# Patient Record
Sex: Female | Born: 1970 | Race: White | Hispanic: No | Marital: Married | Smoking: Former smoker
Health system: Southern US, Community
[De-identification: ages and names within clinical notes are randomized; demographics above are authoritative.]

## PROBLEM LIST (undated history)

## (undated) DIAGNOSIS — F419 Anxiety disorder, unspecified: Secondary | ICD-10-CM

## (undated) DIAGNOSIS — K589 Irritable bowel syndrome without diarrhea: Secondary | ICD-10-CM

## (undated) DIAGNOSIS — M1990 Unspecified osteoarthritis, unspecified site: Secondary | ICD-10-CM

## (undated) DIAGNOSIS — IMO0002 Reserved for concepts with insufficient information to code with codable children: Secondary | ICD-10-CM

## (undated) DIAGNOSIS — F32A Depression, unspecified: Secondary | ICD-10-CM

## (undated) DIAGNOSIS — G43909 Migraine, unspecified, not intractable, without status migrainosus: Secondary | ICD-10-CM

## (undated) DIAGNOSIS — A630 Anogenital (venereal) warts: Secondary | ICD-10-CM

## (undated) HISTORY — DX: Anogenital (venereal) warts: A63.0

## (undated) HISTORY — DX: Reserved for concepts with insufficient information to code with codable children: IMO0002

## (undated) HISTORY — PX: TONSILLECTOMY AND ADENOIDECTOMY: SUR1326

## (undated) HISTORY — DX: Irritable bowel syndrome, unspecified: K58.9

## (undated) HISTORY — DX: Migraine, unspecified, not intractable, without status migrainosus: G43.909

## (undated) HISTORY — PX: BREAST ENHANCEMENT SURGERY: SHX7

## (undated) HISTORY — PX: INTRAUTERINE DEVICE (IUD) INSERTION: SHX5877

## (undated) HISTORY — PX: COLPOSCOPY: SHX161

---

## 1998-03-22 ENCOUNTER — Other Ambulatory Visit: Admission: RE | Admit: 1998-03-22 | Discharge: 1998-03-22 | Payer: Self-pay | Admitting: Obstetrics and Gynecology

## 2001-08-17 ENCOUNTER — Other Ambulatory Visit: Admission: RE | Admit: 2001-08-17 | Discharge: 2001-08-17 | Payer: Self-pay | Admitting: Obstetrics and Gynecology

## 2002-08-08 ENCOUNTER — Other Ambulatory Visit: Admission: RE | Admit: 2002-08-08 | Discharge: 2002-08-08 | Payer: Self-pay | Admitting: Obstetrics and Gynecology

## 2003-08-11 ENCOUNTER — Other Ambulatory Visit: Admission: RE | Admit: 2003-08-11 | Discharge: 2003-08-11 | Payer: Self-pay | Admitting: Obstetrics and Gynecology

## 2004-11-24 HISTORY — PX: AUGMENTATION MAMMAPLASTY: SUR837

## 2004-12-02 ENCOUNTER — Other Ambulatory Visit: Admission: RE | Admit: 2004-12-02 | Discharge: 2004-12-02 | Payer: Self-pay | Admitting: Obstetrics and Gynecology

## 2006-01-27 ENCOUNTER — Other Ambulatory Visit: Admission: RE | Admit: 2006-01-27 | Discharge: 2006-01-27 | Payer: Self-pay | Admitting: Obstetrics and Gynecology

## 2006-07-29 ENCOUNTER — Encounter: Admission: RE | Admit: 2006-07-29 | Discharge: 2006-07-29 | Payer: Self-pay | Admitting: Family Medicine

## 2007-03-16 ENCOUNTER — Other Ambulatory Visit: Admission: RE | Admit: 2007-03-16 | Discharge: 2007-03-16 | Payer: Self-pay | Admitting: Obstetrics and Gynecology

## 2008-04-18 ENCOUNTER — Other Ambulatory Visit: Admission: RE | Admit: 2008-04-18 | Discharge: 2008-04-18 | Payer: Self-pay | Admitting: Obstetrics and Gynecology

## 2008-10-16 ENCOUNTER — Encounter: Admission: RE | Admit: 2008-10-16 | Discharge: 2008-10-16 | Payer: Self-pay | Admitting: Family Medicine

## 2009-10-31 ENCOUNTER — Encounter: Admission: RE | Admit: 2009-10-31 | Discharge: 2009-10-31 | Payer: Self-pay | Admitting: Sports Medicine

## 2009-11-22 ENCOUNTER — Encounter: Admission: RE | Admit: 2009-11-22 | Discharge: 2009-11-22 | Payer: Self-pay | Admitting: Sports Medicine

## 2009-11-26 ENCOUNTER — Encounter: Admission: RE | Admit: 2009-11-26 | Discharge: 2009-11-26 | Payer: Self-pay | Admitting: Family Medicine

## 2010-04-26 ENCOUNTER — Encounter: Admission: RE | Admit: 2010-04-26 | Discharge: 2010-04-26 | Payer: Self-pay | Admitting: Sports Medicine

## 2010-05-16 ENCOUNTER — Encounter: Admission: RE | Admit: 2010-05-16 | Discharge: 2010-05-16 | Payer: Self-pay | Admitting: Sports Medicine

## 2010-10-13 ENCOUNTER — Encounter: Admission: RE | Admit: 2010-10-13 | Discharge: 2010-10-13 | Payer: Self-pay | Admitting: Family Medicine

## 2010-10-22 ENCOUNTER — Encounter: Admission: RE | Admit: 2010-10-22 | Discharge: 2010-10-22 | Payer: Self-pay | Admitting: Sports Medicine

## 2010-11-07 ENCOUNTER — Encounter
Admission: RE | Admit: 2010-11-07 | Discharge: 2010-11-07 | Payer: Self-pay | Source: Home / Self Care | Attending: Sports Medicine | Admitting: Sports Medicine

## 2010-12-14 ENCOUNTER — Encounter: Payer: Self-pay | Admitting: Family Medicine

## 2011-01-02 ENCOUNTER — Other Ambulatory Visit: Payer: Self-pay | Admitting: Obstetrics and Gynecology

## 2011-01-02 DIAGNOSIS — Z1231 Encounter for screening mammogram for malignant neoplasm of breast: Secondary | ICD-10-CM

## 2011-01-09 ENCOUNTER — Ambulatory Visit
Admission: RE | Admit: 2011-01-09 | Discharge: 2011-01-09 | Disposition: A | Discharge status: Home / Self Care | Payer: 59 | Source: Ambulatory Visit | Attending: Obstetrics and Gynecology | Admitting: Obstetrics and Gynecology

## 2011-01-09 DIAGNOSIS — Z1231 Encounter for screening mammogram for malignant neoplasm of breast: Secondary | ICD-10-CM

## 2011-05-06 ENCOUNTER — Other Ambulatory Visit: Payer: Self-pay | Admitting: Sports Medicine

## 2011-05-06 DIAGNOSIS — M549 Dorsalgia, unspecified: Secondary | ICD-10-CM

## 2011-05-09 ENCOUNTER — Ambulatory Visit
Admission: RE | Admit: 2011-05-09 | Discharge: 2011-05-09 | Disposition: A | Discharge status: Home / Self Care | Payer: 59 | Source: Ambulatory Visit | Attending: Sports Medicine | Admitting: Sports Medicine

## 2011-05-09 ENCOUNTER — Other Ambulatory Visit: Payer: Self-pay | Admitting: Sports Medicine

## 2011-05-09 DIAGNOSIS — M545 Low back pain, unspecified: Secondary | ICD-10-CM

## 2011-05-09 DIAGNOSIS — M549 Dorsalgia, unspecified: Secondary | ICD-10-CM

## 2011-06-03 ENCOUNTER — Ambulatory Visit
Admission: RE | Admit: 2011-06-03 | Discharge: 2011-06-03 | Disposition: A | Discharge status: Home / Self Care | Payer: 59 | Source: Ambulatory Visit | Attending: Sports Medicine | Admitting: Sports Medicine

## 2011-06-03 DIAGNOSIS — M545 Low back pain, unspecified: Secondary | ICD-10-CM

## 2011-10-21 ENCOUNTER — Other Ambulatory Visit: Payer: Self-pay | Admitting: Sports Medicine

## 2011-10-21 DIAGNOSIS — M545 Low back pain, unspecified: Secondary | ICD-10-CM

## 2011-10-27 ENCOUNTER — Ambulatory Visit
Admission: RE | Admit: 2011-10-27 | Discharge: 2011-10-27 | Disposition: A | Discharge status: Home / Self Care | Payer: 59 | Source: Ambulatory Visit | Attending: Sports Medicine | Admitting: Sports Medicine

## 2011-10-27 DIAGNOSIS — M545 Low back pain, unspecified: Secondary | ICD-10-CM

## 2011-10-27 MED ORDER — IOHEXOL 180 MG/ML  SOLN
1.0000 mL | Freq: Once | INTRAMUSCULAR | Status: AC | PRN
  Administered 2011-10-27: 1 mL via EPIDURAL

## 2011-10-27 MED ORDER — METHYLPREDNISOLONE ACETATE 40 MG/ML INJ SUSP (RADIOLOG
120.0000 mg | Freq: Once | INTRAMUSCULAR | Status: AC
  Administered 2011-10-27: 120 mg via EPIDURAL

## 2011-10-28 ENCOUNTER — Other Ambulatory Visit: Payer: Self-pay | Admitting: Sports Medicine

## 2011-10-28 DIAGNOSIS — M5136 Other intervertebral disc degeneration, lumbar region: Secondary | ICD-10-CM

## 2011-11-11 ENCOUNTER — Ambulatory Visit
Admission: RE | Admit: 2011-11-11 | Discharge: 2011-11-11 | Disposition: A | Discharge status: Home / Self Care | Payer: 59 | Source: Ambulatory Visit | Attending: Sports Medicine | Admitting: Sports Medicine

## 2011-11-11 DIAGNOSIS — M5136 Other intervertebral disc degeneration, lumbar region: Secondary | ICD-10-CM

## 2011-11-11 MED ORDER — METHYLPREDNISOLONE ACETATE 40 MG/ML INJ SUSP (RADIOLOG
120.0000 mg | Freq: Once | INTRAMUSCULAR | Status: AC
  Administered 2011-11-11: 120 mg via EPIDURAL

## 2011-11-11 MED ORDER — IOHEXOL 180 MG/ML  SOLN
1.0000 mL | Freq: Once | INTRAMUSCULAR | Status: AC | PRN
  Administered 2011-11-11: 1 mL via EPIDURAL

## 2011-11-25 DIAGNOSIS — IMO0002 Reserved for concepts with insufficient information to code with codable children: Secondary | ICD-10-CM

## 2011-11-25 DIAGNOSIS — R87619 Unspecified abnormal cytological findings in specimens from cervix uteri: Secondary | ICD-10-CM

## 2011-11-25 HISTORY — DX: Unspecified abnormal cytological findings in specimens from cervix uteri: R87.619

## 2011-11-25 HISTORY — DX: Reserved for concepts with insufficient information to code with codable children: IMO0002

## 2011-12-30 ENCOUNTER — Other Ambulatory Visit: Payer: Self-pay | Admitting: Obstetrics and Gynecology

## 2011-12-30 ENCOUNTER — Other Ambulatory Visit: Payer: Self-pay | Admitting: Family Medicine

## 2011-12-30 DIAGNOSIS — Z1231 Encounter for screening mammogram for malignant neoplasm of breast: Secondary | ICD-10-CM

## 2012-01-19 ENCOUNTER — Ambulatory Visit
Admission: RE | Admit: 2012-01-19 | Discharge: 2012-01-19 | Disposition: A | Discharge status: Home / Self Care | Payer: 59 | Source: Ambulatory Visit | Attending: Family Medicine | Admitting: Family Medicine

## 2012-01-19 DIAGNOSIS — Z1231 Encounter for screening mammogram for malignant neoplasm of breast: Secondary | ICD-10-CM

## 2012-01-23 ENCOUNTER — Other Ambulatory Visit: Payer: Self-pay | Admitting: Family Medicine

## 2012-01-23 DIAGNOSIS — R928 Other abnormal and inconclusive findings on diagnostic imaging of breast: Secondary | ICD-10-CM

## 2012-01-28 ENCOUNTER — Ambulatory Visit
Admission: RE | Admit: 2012-01-28 | Discharge: 2012-01-28 | Disposition: A | Discharge status: Home / Self Care | Payer: 59 | Source: Ambulatory Visit | Attending: Family Medicine | Admitting: Family Medicine

## 2012-01-28 DIAGNOSIS — R928 Other abnormal and inconclusive findings on diagnostic imaging of breast: Secondary | ICD-10-CM

## 2012-02-04 ENCOUNTER — Other Ambulatory Visit: Payer: 59

## 2012-05-13 ENCOUNTER — Other Ambulatory Visit: Payer: Self-pay | Admitting: Sports Medicine

## 2012-05-13 DIAGNOSIS — M545 Low back pain, unspecified: Secondary | ICD-10-CM

## 2012-05-14 ENCOUNTER — Other Ambulatory Visit: Payer: Self-pay | Admitting: Sports Medicine

## 2012-05-14 ENCOUNTER — Ambulatory Visit
Admission: RE | Admit: 2012-05-14 | Discharge: 2012-05-14 | Disposition: A | Discharge status: Home / Self Care | Payer: 59 | Source: Ambulatory Visit | Attending: Sports Medicine | Admitting: Sports Medicine

## 2012-05-14 VITALS — BP 141/82 | HR 80

## 2012-05-14 DIAGNOSIS — M545 Low back pain, unspecified: Secondary | ICD-10-CM

## 2012-05-14 DIAGNOSIS — M549 Dorsalgia, unspecified: Secondary | ICD-10-CM

## 2012-05-14 MED ORDER — METHYLPREDNISOLONE ACETATE 40 MG/ML INJ SUSP (RADIOLOG
120.0000 mg | Freq: Once | INTRAMUSCULAR | Status: AC
  Administered 2012-05-14: 120 mg via EPIDURAL

## 2012-05-14 MED ORDER — IOHEXOL 180 MG/ML  SOLN
1.0000 mL | Freq: Once | INTRAMUSCULAR | Status: AC | PRN
  Administered 2012-05-14: 1 mL via EPIDURAL

## 2012-05-28 ENCOUNTER — Ambulatory Visit
Admission: RE | Admit: 2012-05-28 | Discharge: 2012-05-28 | Disposition: A | Discharge status: Home / Self Care | Payer: 59 | Source: Ambulatory Visit | Attending: Sports Medicine | Admitting: Sports Medicine

## 2012-05-28 DIAGNOSIS — M549 Dorsalgia, unspecified: Secondary | ICD-10-CM

## 2012-05-28 MED ORDER — IOHEXOL 180 MG/ML  SOLN
1.0000 mL | Freq: Once | INTRAMUSCULAR | Status: AC | PRN
  Administered 2012-05-28: 1 mL via EPIDURAL

## 2012-05-28 MED ORDER — METHYLPREDNISOLONE ACETATE 40 MG/ML INJ SUSP (RADIOLOG
120.0000 mg | Freq: Once | INTRAMUSCULAR | Status: AC
  Administered 2012-05-28: 120 mg via EPIDURAL

## 2012-10-04 ENCOUNTER — Other Ambulatory Visit: Payer: Self-pay | Admitting: Sports Medicine

## 2012-10-04 DIAGNOSIS — M545 Low back pain, unspecified: Secondary | ICD-10-CM

## 2012-10-11 ENCOUNTER — Ambulatory Visit
Admission: RE | Admit: 2012-10-11 | Discharge: 2012-10-11 | Disposition: A | Discharge status: Home / Self Care | Payer: 59 | Source: Ambulatory Visit | Attending: Sports Medicine | Admitting: Sports Medicine

## 2012-10-11 VITALS — BP 115/67 | HR 86

## 2012-10-11 DIAGNOSIS — M545 Low back pain, unspecified: Secondary | ICD-10-CM

## 2012-10-11 MED ORDER — IOHEXOL 180 MG/ML  SOLN
1.0000 mL | Freq: Once | INTRAMUSCULAR | Status: AC | PRN
  Administered 2012-10-11: 1 mL via EPIDURAL

## 2012-10-11 MED ORDER — METHYLPREDNISOLONE ACETATE 40 MG/ML INJ SUSP (RADIOLOG
120.0000 mg | Freq: Once | INTRAMUSCULAR | Status: AC
  Administered 2012-10-11: 120 mg via EPIDURAL

## 2013-02-01 ENCOUNTER — Other Ambulatory Visit: Payer: Self-pay

## 2013-02-01 DIAGNOSIS — Z1231 Encounter for screening mammogram for malignant neoplasm of breast: Secondary | ICD-10-CM

## 2013-03-09 ENCOUNTER — Ambulatory Visit
Admission: RE | Admit: 2013-03-09 | Discharge: 2013-03-09 | Disposition: A | Discharge status: Home / Self Care | Payer: 59 | Source: Ambulatory Visit

## 2013-03-09 DIAGNOSIS — Z1231 Encounter for screening mammogram for malignant neoplasm of breast: Secondary | ICD-10-CM

## 2013-05-17 ENCOUNTER — Other Ambulatory Visit: Payer: Self-pay | Admitting: Orthopedic Surgery

## 2013-05-17 DIAGNOSIS — M545 Low back pain, unspecified: Secondary | ICD-10-CM

## 2013-05-17 DIAGNOSIS — R531 Weakness: Secondary | ICD-10-CM

## 2013-07-01 ENCOUNTER — Ambulatory Visit
Admission: RE | Admit: 2013-07-01 | Discharge: 2013-07-01 | Disposition: A | Discharge status: Home / Self Care | Payer: 59 | Source: Ambulatory Visit | Attending: Orthopedic Surgery | Admitting: Orthopedic Surgery

## 2013-07-01 ENCOUNTER — Other Ambulatory Visit: Payer: Self-pay | Admitting: Orthopedic Surgery

## 2013-07-01 DIAGNOSIS — M545 Low back pain, unspecified: Secondary | ICD-10-CM

## 2013-07-01 DIAGNOSIS — R531 Weakness: Secondary | ICD-10-CM

## 2013-07-01 MED ORDER — IOHEXOL 180 MG/ML  SOLN
1.0000 mL | Freq: Once | INTRAMUSCULAR | Status: AC | PRN
  Administered 2013-07-01: 1 mL via EPIDURAL

## 2013-07-01 MED ORDER — METHYLPREDNISOLONE ACETATE 40 MG/ML INJ SUSP (RADIOLOG
120.0000 mg | Freq: Once | INTRAMUSCULAR | Status: AC
  Administered 2013-07-01: 120 mg via EPIDURAL

## 2013-07-11 ENCOUNTER — Ambulatory Visit
Admission: RE | Admit: 2013-07-11 | Discharge: 2013-07-11 | Disposition: A | Discharge status: Home / Self Care | Payer: 59 | Source: Ambulatory Visit | Attending: Orthopedic Surgery | Admitting: Orthopedic Surgery

## 2013-07-11 VITALS — BP 147/66 | HR 74

## 2013-07-11 DIAGNOSIS — M545 Low back pain, unspecified: Secondary | ICD-10-CM

## 2013-07-11 MED ORDER — IOHEXOL 180 MG/ML  SOLN
1.0000 mL | Freq: Once | INTRAMUSCULAR | Status: AC | PRN
  Administered 2013-07-11: 1 mL via EPIDURAL

## 2013-07-11 MED ORDER — METHYLPREDNISOLONE ACETATE 40 MG/ML INJ SUSP (RADIOLOG
120.0000 mg | Freq: Once | INTRAMUSCULAR | Status: AC
  Administered 2013-07-11: 120 mg via EPIDURAL

## 2013-09-28 ENCOUNTER — Encounter: Payer: Self-pay | Admitting: Certified Nurse Midwife

## 2013-09-29 ENCOUNTER — Ambulatory Visit (INDEPENDENT_AMBULATORY_CARE_PROVIDER_SITE_OTHER): Payer: 59 | Admitting: Certified Nurse Midwife

## 2013-09-29 ENCOUNTER — Encounter: Payer: Self-pay | Admitting: Certified Nurse Midwife

## 2013-09-29 VITALS — BP 112/68 | HR 68 | Resp 16 | Ht 68.25 in | Wt 244.0 lb

## 2013-09-29 DIAGNOSIS — Z0000 Encounter for general adult medical examination without abnormal findings: Secondary | ICD-10-CM

## 2013-09-29 DIAGNOSIS — Z01419 Encounter for gynecological examination (general) (routine) without abnormal findings: Secondary | ICD-10-CM

## 2013-09-29 LAB — POCT URINALYSIS DIPSTICK
Ketones, UA: NEGATIVE
Leukocytes, UA: NEGATIVE
Nitrite, UA: NEGATIVE
Protein, UA: NEGATIVE
pH, UA: 5

## 2013-09-29 NOTE — Progress Notes (Signed)
42 y.o. G0P0000 Divorced Caucasian Fe here for annual exam. Periods none with IUD.Marland Kitchen Patient has been having breast soreness with movement, and exercise. Had normal mammogram with 3 D. Denies breast skin change or nipple discharge. Patient wears sports bra all the time and has implants and works out daily. ? Soreness related to bra? Has lost 38 pounds! No partner change, no STD screening needed. Sees PCP prn, had labs with Neurologist. No other health issues today.  Looking [place] retreat!  Patient's last menstrual period was 09/25/2012.          Sexually active: yes  The current method of family planning is IUD.    Exercising: yes  cardio Smoker:  no  Health Maintenance: Pap:  09-27-12 neg +HPV HR, 16/18 genotype neg MMG:  4/14 Colonoscopy:  none BMD:   none TDaP:  2012 Labs: Poct urine-neg Self breast exam: done monthly   reports that she has quit smoking. She does not have any smokeless tobacco history on file. She reports that she drinks about 2.0 ounces of alcohol per week. She reports that she does not use illicit drugs.  Past Medical History  Diagnosis Date  . Condyloma   . Migraines     chronic,no aura  . IBS (irritable bowel syndrome)     Past Surgical History  Procedure Laterality Date  . Intrauterine device (iud) insertion      inserted in 06,removed, then new one inserted 4/11  . Breast enhancement surgery    . Tonsillectomy and adenoidectomy      Current Outpatient Prescriptions  Medication Sig Dispense Refill  . Cyclobenzaprine HCl (FLEXERIL PO) Take by mouth as needed.      Marland Kitchen levonorgestrel (MIRENA) 20 MCG/24HR IUD 1 each by Intrauterine route once.      . Multiple Vitamins-Minerals (MULTIVITAMIN PO) Take by mouth as needed.      . Naproxen (NAPROSYN PO) Take by mouth as needed.      . Topiramate (TOPAMAX PO) Take by mouth daily.        No current facility-administered medications for this visit.    Family History  Problem Relation Age of  Onset  . Hypertension Brother   . Diabetes Mother     ROS:  Pertinent items are noted in HPI.  Otherwise, a comprehensive ROS was negative.  Exam:   BP 112/68  Pulse 68  Resp 16  Ht 5' 8.25" (1.734 m)  Wt 244 lb (110.678 kg)  BMI 36.81 kg/m2  LMP 09/25/2012 Height: 5' 8.25" (173.4 cm)  Ht Readings from Last 3 Encounters:  09/29/13 5' 8.25" (1.734 m)    General appearance: alert, cooperative and appears stated age Head: Normocephalic, without obvious abnormality, atraumatic Neck: no adenopathy, supple, symmetrical, trachea midline and thyroid normal to inspection and palpation Lungs: clear to auscultation bilaterally Breasts: normal appearance, no masses or tenderness, No nipple retraction or dimpling, No nipple discharge or bleeding, No axillary or supraclavicular adenopathy Non tender to touch, points to outer edge of breast next to chest wall as sore area. Implants appear intact. Heart: regular rate and rhythm Abdomen: soft, non-tender; no masses,  no organomegaly Extremities: extremities normal, atraumatic, no cyanosis or edema Skin: Skin color, texture, turgor normal. No rashes or lesions Lymph nodes: Cervical, supraclavicular, and axillary nodes normal. No abnormal inguinal nodes palpated Neurologic: Grossly normal   Pelvic: External genitalia:  no lesions              Urethra:  normal appearing  urethra with no masses, tenderness or lesions              Bartholin's and Skene's: normal                 Vagina: normal appearing vagina with normal color and discharge, no lesions              Cervix: normal, non tender IUD string noted in cervix              Pap taken: yes Bimanual Exam:  Uterus:  normal size, contour, position, consistency, mobility, non-tender and anteverted              Adnexa: normal adnexa and no mass, fullness, tenderness               Rectovaginal: Confirms               Anus:  normal sphincter tone, no lesions  A:  Well Woman with normal  exam  Contraception Mirena IUD  History of negative pap with HPVHR +, 16, 18 negative  Breast soreness due to exercise or non supportive bra? Normal mammogram report reviewed. Normal exam  P:   Reviewed health and wellness pertinent to exam  Aware of warning signs with IUD due for removal 02/2015  Discussed wearing regular bra to see if soreness changes and decrease jumping with exercise for a week to see if change, also NSAID for 24-48 hours to see if change, if no change needs to advise. Patient feels it is her bra and the heaviness of implants. Also advised she may want to check with the MD who put implants in if continues also. Patient agrees.  Pap smear as per guidelines   Mammogram yearly pap smear taken today with HPVHR  counseled on breast self exam, mammography screening, adequate intake of calcium and vitamin D, diet and exercise return annually or prn   An After Visit Summary was printed and given to the patient.

## 2013-09-29 NOTE — Patient Instructions (Signed)

## 2013-10-02 NOTE — Progress Notes (Signed)
Note reviewed, agree with plan.  Jemal Miskell, MD  

## 2013-10-05 LAB — IPS PAP TEST WITH HPV

## 2013-10-06 ENCOUNTER — Other Ambulatory Visit: Payer: Self-pay | Admitting: Certified Nurse Midwife

## 2013-10-06 DIAGNOSIS — IMO0001 Reserved for inherently not codable concepts without codable children: Secondary | ICD-10-CM

## 2013-10-10 ENCOUNTER — Telehealth: Payer: Self-pay | Admitting: Emergency Medicine

## 2013-10-10 NOTE — Telephone Encounter (Signed)
Patient is returning a call to Tracy. °

## 2013-10-10 NOTE — Telephone Encounter (Signed)
Message left to return call to Juelle Dickmann at [phone].    

## 2013-10-10 NOTE — Telephone Encounter (Signed)
There is an order placed on  10/08/13 under procedues

## 2013-10-10 NOTE — Telephone Encounter (Signed)
Samantha Bird can you place an order for her colpo? I do not see one in.    I spoke with patient and informed of abnormal pap. Identity verified. Ensured this was a good time to discuss lab results.   Patient informed of lab results with abnormal pap.   Advised will need colposcopy for evaluation for further testing. Advised that colposcopy is very important test for follow up to evaluate cells.   Educated regarding procedure:  Looking at the cervix with a microscope to help see abnormal cells.  Sometimes a biopsy is taken, feels like a pinch then a cramp. Biopsies are then sent for testing.  When we have results, provider will decided time intervals for follow up. Procedure takes about 20-30 minutes.   Educated regarding instructions prior to procedure: Do not have intercourse the day before procedure.  Motrin instructions given. Motrin=Advil=Ibuprofen Can take 800 mg (Can purchase over the counter, you will need four 200 mg pills) for cramps.  Take with food. Make sure to eat a meal before appointment and drink plenty of fluids. Patient has IUD.   At end of call, patient states she does not have any further questions for me. Patient returned call about 20 minutes later and requested an earlier appointment. I advised that I placed her in earliest spot that could best accommodate her for length of procedure. Patient requested that she be called if any openings came up and I advised that it may be difficult because of length of time needed for appointment but that I would call if a time opened up.  Patient is agreeable. Patient sounded anxious on the phone and I attempted to assist with anxiety by advising that this was the next step only after abnormal pap and advised that provider can answer any further questions before procedure.

## 2013-10-10 NOTE — Telephone Encounter (Signed)
Thank you. Linked to appointment.

## 2013-10-10 NOTE — Telephone Encounter (Signed)
Message copied by Joeseph Amor on Mon Oct 10, 2013  9:32 AM ------      Message from: Verner Chol      Created: Thu Oct 06, 2013  7:42 AM       Notify patient that pap smear follow up is ASCUS and need colposcopy exam, HPVHR negative, but per recommendations if pap not negative needs colpo exam      Order in ------

## 2013-10-12 ENCOUNTER — Encounter: Payer: Self-pay | Admitting: Certified Nurse Midwife

## 2013-10-12 ENCOUNTER — Ambulatory Visit (INDEPENDENT_AMBULATORY_CARE_PROVIDER_SITE_OTHER): Payer: 59 | Admitting: Certified Nurse Midwife

## 2013-10-12 VITALS — BP 110/68 | HR 72 | Resp 16 | Ht 68.25 in | Wt 244.0 lb

## 2013-10-12 DIAGNOSIS — R6889 Other general symptoms and signs: Secondary | ICD-10-CM

## 2013-10-12 DIAGNOSIS — IMO0001 Reserved for inherently not codable concepts without codable children: Secondary | ICD-10-CM

## 2013-10-12 NOTE — Patient Instructions (Signed)

## 2013-10-12 NOTE — Progress Notes (Signed)
Patient ID: Samantha Bird, female   DOB: 1971/07/21, 42 y.o.   MRN: [id number]  Chief Complaint  Patient presents with  . Colposcopy    HPI Samantha Bird is a 42 y.o. female.  gopo here for colposcopy. Denies vaginal issues or bleeding. Patient has Mirena IUD in for contraception. Denies any problems today. HPI  Indications: Pap smear on September 30, 2013 showed: ASCUS with NEGATIVE high risk HPV. Previous pap HPVHR positive with 16,18 negative in 09/27/12. No treatment given.  Past Medical History  Diagnosis Date  . Condyloma   . Migraines     chronic,no aura  . IBS (irritable bowel syndrome)     Past Surgical History  Procedure Laterality Date  . Intrauterine device (iud) insertion      inserted in 06,removed, then new one inserted 4/11  . Breast enhancement surgery    . Tonsillectomy and adenoidectomy      Family History  Problem Relation Age of Onset  . Hypertension Brother   . Diabetes Mother     Social History History  Substance Use Topics  . Smoking status: Former Games developer  . Smokeless tobacco: Not on file  . Alcohol Use: 2.0 oz/week    4 drink(s) per week    Allergies  Allergen Reactions  . Penicillins Hives and Swelling    As a child     Current Outpatient Prescriptions  Medication Sig Dispense Refill  . Cyclobenzaprine HCl (FLEXERIL PO) Take by mouth as needed.      Marland Kitchen levonorgestrel (MIRENA) 20 MCG/24HR IUD 1 each by Intrauterine route once.      . Multiple Vitamins-Minerals (MULTIVITAMIN PO) Take by mouth as needed.      . Naproxen (NAPROSYN PO) Take by mouth as needed.      . Topiramate (TOPAMAX PO) Take by mouth daily.        No current facility-administered medications for this visit.    Review of Systems Review of Systems  Constitutional: Negative.   Genitourinary: Negative for vaginal bleeding, vaginal discharge and vaginal pain.    Blood pressure 110/68, pulse 72, resp. rate 16, height 5' 8.25" (1.734 m), weight 244 lb  (110.678 kg), last menstrual period 09/25/2012.  Physical Exam Physical Exam  Constitutional: She is oriented to person, place, and time. She appears well-developed and well-nourished.  Genitourinary: No vaginal discharge found.    Neurological: She is alert and oriented to person, place, and time.  Skin: Skin is warm and dry.  Psychiatric: She has a normal mood and affect. Judgment normal.    Data Reviewed Reviewed pap smear with patient and previous pap smear results.  Assessment   Healthy female here for Colposcopy for ASCUS with previous HPVHR positive.  Procedure Details  The risks and benefits of the procedure and Written informed consent obtained.Questions addressed.  Speculum placed in vagina and excellent visualization of cervix achieved, IUD string noted, cervix swabbed x 3 with saline solution and acetic acid solution. Cervix viewed with 3.75,7.5,15 # and green filter.HPV effect noted. No acetowhite lesion noted. Lugol's applied with staining noted. ECC obtained, with minimal bleeding and IUD string not involved. Monsel's applied. No bleeding noted on speculum removal. Patient tolerated procedure well. Instructions given.  Specimens: one  Complications: none.     Plan    Specimens labelled and sent to Pathology. Patient will notified of results when reviewed.    Pathology results: showed benign squamous mucosa with no dysplasia or HPV effect noted. Negative for endocervical glandular population.  Patient will be notified of results and need for repeat pap in one year. Pap recall   [facility] 10/12/2013, 2:22 PM

## 2013-10-12 NOTE — Progress Notes (Signed)
Pt has hx of ASCUS, -HPV on pap 09/30/13. Pt took 800mg  ibuprofen at 12:45

## 2013-10-19 ENCOUNTER — Telehealth: Payer: Self-pay | Admitting: *Deleted

## 2013-10-19 NOTE — Telephone Encounter (Signed)
VM confirms patient first and last name. LM calling with good report.LMTCB.

## 2013-10-19 NOTE — Telephone Encounter (Signed)
Message copied by Alisa Graff on Wed Oct 19, 2013  3:08 PM ------      Message from: Verner Chol      Created: Tue Oct 18, 2013  4:23 PM       Notify patient that pathology of Ecc showed benign squamous mucosa with no dysplasia or HPV effect. Negative for glandular changes.      Repeat pap smear in one year       Pap recall 08 ------

## 2013-10-19 NOTE — Progress Notes (Signed)
Note reviewed, agree with plan.  Morrell Fluke, MD  

## 2013-10-27 NOTE — Telephone Encounter (Signed)
LMTCB. [id number]

## 2013-10-27 NOTE — Telephone Encounter (Signed)
Message copied by Alisa Graff on Thu Oct 27, 2013 10:24 AM ------      Message from: Verner Chol      Created: Tue Oct 18, 2013  4:23 PM       Notify patient that pathology of Ecc showed benign squamous mucosa with no dysplasia or HPV effect. Negative for glandular changes.      Repeat pap smear in one year       Pap recall 08 ------

## 2013-11-01 NOTE — Telephone Encounter (Signed)
Patient returning call, notified of path results as directed by Debbi. Stressed importance of follow up pap in one year.  Patient is already scheduled for 10-02-14 and voiced understanding of follow up. 08 Recall entered.  Routing to provider for final review. Patient agreeable to disposition. Will close encounter

## 2014-02-13 ENCOUNTER — Other Ambulatory Visit: Payer: Self-pay | Admitting: Orthopedic Surgery

## 2014-02-13 DIAGNOSIS — M545 Low back pain, unspecified: Secondary | ICD-10-CM

## 2014-02-27 ENCOUNTER — Ambulatory Visit
Admission: RE | Admit: 2014-02-27 | Discharge: 2014-02-27 | Disposition: A | Discharge status: Home / Self Care | Payer: 59 | Source: Ambulatory Visit | Attending: Orthopedic Surgery | Admitting: Orthopedic Surgery

## 2014-02-27 DIAGNOSIS — M545 Low back pain, unspecified: Secondary | ICD-10-CM

## 2014-02-27 MED ORDER — IOHEXOL 180 MG/ML  SOLN
1.0000 mL | Freq: Once | INTRAMUSCULAR | Status: AC | PRN
  Administered 2014-02-27: 1 mL via EPIDURAL

## 2014-02-27 MED ORDER — METHYLPREDNISOLONE ACETATE 40 MG/ML INJ SUSP (RADIOLOG
120.0000 mg | Freq: Once | INTRAMUSCULAR | Status: AC
  Administered 2014-02-27: 120 mg via EPIDURAL

## 2014-02-27 NOTE — Discharge Instructions (Signed)

## 2014-03-13 ENCOUNTER — Other Ambulatory Visit: Payer: Self-pay | Admitting: Orthopedic Surgery

## 2014-03-13 DIAGNOSIS — M549 Dorsalgia, unspecified: Secondary | ICD-10-CM

## 2014-04-06 ENCOUNTER — Ambulatory Visit
Admission: RE | Admit: 2014-04-06 | Discharge: 2014-04-06 | Disposition: A | Discharge status: Home / Self Care | Payer: 59 | Source: Ambulatory Visit | Attending: Orthopedic Surgery | Admitting: Orthopedic Surgery

## 2014-04-06 DIAGNOSIS — M549 Dorsalgia, unspecified: Secondary | ICD-10-CM

## 2014-04-06 MED ORDER — IOHEXOL 180 MG/ML  SOLN
1.0000 mL | Freq: Once | INTRAMUSCULAR | Status: AC | PRN
  Administered 2014-04-06: 1 mL via EPIDURAL

## 2014-04-06 MED ORDER — METHYLPREDNISOLONE ACETATE 40 MG/ML INJ SUSP (RADIOLOG
120.0000 mg | Freq: Once | INTRAMUSCULAR | Status: AC
  Administered 2014-04-06: 120 mg via EPIDURAL

## 2014-04-10 ENCOUNTER — Other Ambulatory Visit: Payer: Self-pay

## 2014-04-10 DIAGNOSIS — Z1231 Encounter for screening mammogram for malignant neoplasm of breast: Secondary | ICD-10-CM

## 2014-04-18 ENCOUNTER — Ambulatory Visit
Admission: RE | Admit: 2014-04-18 | Discharge: 2014-04-18 | Disposition: A | Discharge status: Home / Self Care | Payer: 59 | Source: Ambulatory Visit

## 2014-04-18 ENCOUNTER — Encounter (INDEPENDENT_AMBULATORY_CARE_PROVIDER_SITE_OTHER): Payer: Self-pay

## 2014-04-18 DIAGNOSIS — Z1231 Encounter for screening mammogram for malignant neoplasm of breast: Secondary | ICD-10-CM

## 2014-09-06 ENCOUNTER — Other Ambulatory Visit: Payer: Self-pay | Admitting: Orthopedic Surgery

## 2014-09-06 DIAGNOSIS — M545 Low back pain, unspecified: Secondary | ICD-10-CM

## 2014-09-06 DIAGNOSIS — G8929 Other chronic pain: Secondary | ICD-10-CM

## 2014-09-14 ENCOUNTER — Ambulatory Visit
Admission: RE | Admit: 2014-09-14 | Discharge: 2014-09-14 | Disposition: A | Discharge status: Home / Self Care | Payer: 59 | Source: Ambulatory Visit | Attending: Orthopedic Surgery | Admitting: Orthopedic Surgery

## 2014-09-14 VITALS — BP 117/69 | HR 70

## 2014-09-14 DIAGNOSIS — M545 Low back pain, unspecified: Secondary | ICD-10-CM

## 2014-09-14 DIAGNOSIS — G8929 Other chronic pain: Secondary | ICD-10-CM

## 2014-09-14 MED ORDER — IOHEXOL 180 MG/ML  SOLN
1.0000 mL | Freq: Once | INTRAMUSCULAR | Status: AC | PRN
  Administered 2014-09-14: 1 mL via EPIDURAL

## 2014-09-14 MED ORDER — METHYLPREDNISOLONE ACETATE 40 MG/ML INJ SUSP (RADIOLOG
120.0000 mg | Freq: Once | INTRAMUSCULAR | Status: AC
  Administered 2014-09-14: 120 mg via EPIDURAL

## 2014-09-15 ENCOUNTER — Other Ambulatory Visit: Payer: Self-pay | Admitting: Orthopedic Surgery

## 2014-09-15 DIAGNOSIS — M545 Low back pain, unspecified: Secondary | ICD-10-CM

## 2014-09-15 DIAGNOSIS — G8929 Other chronic pain: Secondary | ICD-10-CM

## 2014-09-29 ENCOUNTER — Encounter (HOSPITAL_BASED_OUTPATIENT_CLINIC_OR_DEPARTMENT_OTHER): Payer: Self-pay | Admitting: *Deleted

## 2014-09-29 ENCOUNTER — Emergency Department (HOSPITAL_BASED_OUTPATIENT_CLINIC_OR_DEPARTMENT_OTHER): Payer: 59

## 2014-09-29 ENCOUNTER — Emergency Department (HOSPITAL_BASED_OUTPATIENT_CLINIC_OR_DEPARTMENT_OTHER)
Admission: EM | Admit: 2014-09-29 | Discharge: 2014-09-29 | Disposition: A | Discharge status: Home / Self Care | Payer: 59 | Attending: Emergency Medicine | Admitting: Emergency Medicine

## 2014-09-29 DIAGNOSIS — Z79899 Other long term (current) drug therapy: Secondary | ICD-10-CM | POA: Diagnosis not present

## 2014-09-29 DIAGNOSIS — Z87891 Personal history of nicotine dependence: Secondary | ICD-10-CM | POA: Insufficient documentation

## 2014-09-29 DIAGNOSIS — Z880 Allergy status to penicillin: Secondary | ICD-10-CM | POA: Insufficient documentation

## 2014-09-29 DIAGNOSIS — R0789 Other chest pain: Secondary | ICD-10-CM | POA: Diagnosis not present

## 2014-09-29 DIAGNOSIS — G43909 Migraine, unspecified, not intractable, without status migrainosus: Secondary | ICD-10-CM | POA: Diagnosis not present

## 2014-09-29 DIAGNOSIS — Z8619 Personal history of other infectious and parasitic diseases: Secondary | ICD-10-CM | POA: Insufficient documentation

## 2014-09-29 DIAGNOSIS — Z8719 Personal history of other diseases of the digestive system: Secondary | ICD-10-CM | POA: Insufficient documentation

## 2014-09-29 DIAGNOSIS — R079 Chest pain, unspecified: Secondary | ICD-10-CM | POA: Diagnosis present

## 2014-09-29 LAB — COMPREHENSIVE METABOLIC PANEL
ALK PHOS: 88 U/L (ref 39–117)
ALT: 22 U/L (ref 0–35)
ANION GAP: 14 (ref 5–15)
AST: 15 U/L (ref 0–37)
Albumin: 3.7 g/dL (ref 3.5–5.2)
BUN: 13 mg/dL (ref 6–23)
CALCIUM: 9.4 mg/dL (ref 8.4–10.5)
CO2: 22 mEq/L (ref 19–32)
Chloride: 106 mEq/L (ref 96–112)
Creatinine, Ser: 0.8 mg/dL (ref 0.50–1.10)
GFR calc non Af Amer: 88 mL/min — ABNORMAL LOW (ref >90)
GLUCOSE: 105 mg/dL — AB (ref 70–99)
Potassium: 3.9 mEq/L (ref 3.7–5.3)
Sodium: 142 mEq/L (ref 137–147)
Total Bilirubin: 0.4 mg/dL (ref 0.3–1.2)
Total Protein: 7.6 g/dL (ref 6.0–8.3)

## 2014-09-29 LAB — CBC
HEMATOCRIT: 40.3 % (ref 36.0–46.0)
Hemoglobin: 14.1 g/dL (ref 12.0–15.0)
MCH: 32 pg (ref 26.0–34.0)
MCHC: 35 g/dL (ref 30.0–36.0)
MCV: 91.6 fL (ref 78.0–100.0)
Platelets: 202 10*3/uL (ref 150–400)
RBC: 4.4 MIL/uL (ref 3.87–5.11)
RDW: 11.8 % (ref 11.5–15.5)
WBC: 7.1 10*3/uL (ref 4.0–10.5)

## 2014-09-29 LAB — D-DIMER, QUANTITATIVE: D-Dimer, Quant: <0.27 ug/mL-FEU (ref 0.00–0.48)

## 2014-09-29 LAB — TROPONIN I: Troponin I: <0.3 ng/mL (ref <0.30)

## 2014-09-29 MED ORDER — ASPIRIN 81 MG PO CHEW
324.0000 mg | CHEWABLE_TABLET | Freq: Once | ORAL | Status: AC
  Administered 2014-09-29: 324 mg via ORAL
  Filled 2014-09-29: qty 4

## 2014-09-29 NOTE — Discharge Instructions (Signed)

## 2014-09-29 NOTE — ED Provider Notes (Signed)
CSN: [id number]     Arrival date & time 09/29/14  0841 History   First MD Initiated Contact with Patient 09/29/14 [id number]     Chief Complaint  Patient presents with  . Chest Pain     (Consider location/radiation/quality/duration/timing/severity/associated sxs/prior Treatment) HPI  43 year old female comes in today complaining of pain underneath left breast that began last night in the evening. She cannot identify any inciting factors. The pain was sharp underneath her left breast. It radiated to the left side. She thought there was some tingling in her arm. The pain resolved after she went to bed. She has felt some residual tightness in this area today. It worsens with movement and denies with deep breathing. She does not feel short of breath, no cough, no hemoptysis, or history of DVT or pulmonary embolism or immobilization. She is not having any fever or chills. Is no history of coronary artery disease. It is not worse with exertion but is worse with certain movements. Past Medical History  Diagnosis Date  . Condyloma   . Migraines     chronic,no aura  . IBS (irritable bowel syndrome)   . Abnormal Pap smear 2013    neg pap +HPV HR, 16/18 genotype neg   Past Surgical History  Procedure Laterality Date  . Intrauterine device (iud) insertion      inserted in 06,removed, then new one inserted 4/11  . Breast enhancement surgery    . Tonsillectomy and adenoidectomy     Family History  Problem Relation Age of Onset  . Hypertension Brother   . Diabetes Mother    History  Substance Use Topics  . Smoking status: Former Games developermoker  . Smokeless tobacco: Not on file  . Alcohol Use: 2.0 oz/week    4 drink(s) per week   OB History    Gravida Para Term Preterm AB TAB SAB Ectopic Multiple Living   0 0 0 0 0 0 0 0 0 0      Review of Systems  All other systems reviewed and are negative.     Allergies  Penicillins  Home Medications   Prior to Admission medications   Medication Sig  Start Date End Date Taking? Authorizing Provider  Cyclobenzaprine HCl (FLEXERIL PO) Take by mouth as needed.    Historical Provider, MD  levonorgestrel (MIRENA) 20 MCG/24HR IUD 1 each by Intrauterine route once.    Historical Provider, MD  Multiple Vitamins-Minerals (MULTIVITAMIN PO) Take by mouth as needed.    Historical Provider, MD  Naproxen (NAPROSYN PO) Take by mouth as needed.    Historical Provider, MD  Topiramate (TOPAMAX PO) Take by mouth daily.     Historical Provider, MD   BP 114/74 mmHg  Pulse 80  Temp(Src) 98.1 F (36.7 C) (Oral)  Resp 22  Ht 5\' 9"  (1.753 m)  Wt 250 lb (113.399 kg)  BMI 36.90 kg/m2  SpO2 97% Physical Exam  Constitutional: She is oriented to person, place, and time. She appears well-developed and well-nourished.  HENT:  Head: Normocephalic and atraumatic.  Right Ear: External ear normal.  Left Ear: External ear normal.  Nose: Nose normal.  Mouth/Throat: Oropharynx is clear and moist.  Eyes: Conjunctivae and EOM are normal. Pupils are equal, round, and reactive to light.  Neck: Normal range of motion. Neck supple. No JVD present. No tracheal deviation present. No thyromegaly present.  Cardiovascular: Normal rate, regular rhythm, normal heart sounds and intact distal pulses.   Pulmonary/Chest: Effort normal and breath sounds normal. She has no  wheezes. She exhibits tenderness.    Abdominal: Soft. Bowel sounds are normal. She exhibits no mass. There is no tenderness. There is no guarding.  Musculoskeletal: Normal range of motion.  Lymphadenopathy:    She has no cervical adenopathy.  Neurological: She is alert and oriented to person, place, and time. She has normal reflexes. No cranial nerve deficit or sensory deficit. Gait normal. GCS eye subscore is 4. GCS verbal subscore is 5. GCS motor subscore is 6.  Reflex Scores:      Bicep reflexes are 2+ on the right side and 2+ on the left side.      Patellar reflexes are 2+ on the right side and 2+ on the left  side. Strength is 5/5 bilateral elbow flexor/extensors, wrist extension/flexion, intrinsic hand strength equal Bilateral hip flexion/extension 5/5, knee flexion/extension 5/5, ankle 5/5 flexion extension    Skin: Skin is warm and dry.  Psychiatric: She has a normal mood and affect. Her behavior is normal. Judgment and thought content normal.  Nursing note and vitals reviewed.   ED Course  Procedures (including critical care time) Labs Review Labs Reviewed  COMPREHENSIVE METABOLIC PANEL - Abnormal; Notable for the following:    Glucose, Bld 105 (*)    GFR calc non Af Amer 88 (*)    All other components within normal limits  CBC  TROPONIN I  D-DIMER, QUANTITATIVE    Imaging Review Dg Chest 2 View  09/29/2014   CLINICAL DATA:  Chest pain  EXAM: CHEST  2 VIEW  COMPARISON:  None.  FINDINGS: The heart size and mediastinal contours are within normal limits. Both lungs are clear. The visualized skeletal structures are unremarkable.  IMPRESSION: No active cardiopulmonary disease.   Electronically Signed   By: Alcide CleverMark  Lukens M.D.   On: 09/29/2014 09:48     EKG Interpretation   Date/Time:  Friday September 29 2014 08:50:58 EST Ventricular Rate:  74 PR Interval:  164 QRS Duration: 88 QT Interval:  394 QTC Calculation: 437 R Axis:   57 Text Interpretation:  Normal sinus rhythm Poor R wave progression nsst  Confirmed by Muneer Leider MD, Solveig Fangman ([id number]) on 09/29/2014 8:59:46 AM      MDM   Final diagnoses:  Left-sided chest wall pain    Patient with some minor discomfort here which she does not feel she needs any medication for. Chest x-Bohdi Leeds is clear EKG shows no evidence of acute ischemia although she has poor R-wave progression. Her troponins are normal and d-dimer is negative.she has not a smoker, no history of hypertension, no history of family coronary artery disease. With normal troponin and no acute changes on chest x-Lodema Parma have a low index of suspicion for acute coronary syndrome. I have  advised the patient on return precautions and need for follow-up and she voices understanding.    Hilario Quarryanielle S Javeria Briski, MD 09/29/14 [phone]

## 2014-09-29 NOTE — ED Notes (Signed)
Pt reports "shooting ache" to beneath her left breast x last night, off and on. Pt reports some numbness and tingling to her left arm also, denies any at this time. Pt states she bent over to pick up something and stood up feeling lightheaded. ekg performed while pt being triaged.

## 2014-10-02 ENCOUNTER — Ambulatory Visit: Payer: 59 | Admitting: Certified Nurse Midwife

## 2014-11-13 ENCOUNTER — Ambulatory Visit (INDEPENDENT_AMBULATORY_CARE_PROVIDER_SITE_OTHER): Payer: 59 | Admitting: Certified Nurse Midwife

## 2014-11-13 ENCOUNTER — Encounter: Payer: Self-pay | Admitting: Certified Nurse Midwife

## 2014-11-13 VITALS — BP 108/72 | HR 72 | Resp 16 | Ht 68.25 in | Wt 259.0 lb

## 2014-11-13 DIAGNOSIS — Z124 Encounter for screening for malignant neoplasm of cervix: Secondary | ICD-10-CM

## 2014-11-13 DIAGNOSIS — Z01419 Encounter for gynecological examination (general) (routine) without abnormal findings: Secondary | ICD-10-CM

## 2014-11-13 DIAGNOSIS — Z0000 Encounter for general adult medical examination without abnormal findings: Secondary | ICD-10-CM

## 2014-11-13 LAB — POCT URINALYSIS DIPSTICK
Bilirubin, UA: NEGATIVE
Blood, UA: NEGATIVE
Glucose, UA: NEGATIVE
Ketones, UA: NEGATIVE
Leukocytes, UA: NEGATIVE
Nitrite, UA: NEGATIVE
PROTEIN UA: NEGATIVE
UROBILINOGEN UA: NEGATIVE
pH, UA: 5

## 2014-11-13 NOTE — Progress Notes (Signed)
43 y.o. G0P0000 Divorced Caucasian Fe here for annual exam.  Periods are scant with Mirena IUD. Sees PCP for labs and aex. Was seen in ER for chest wall pain as final diagnosis. Migraines have not increased, saw Neurology for medication management and work up for vertigo. No health issues today. Just returned from another cruise!  No LMP recorded. Patient is not currently having periods (Reason: IUD).          Sexually active: Yes.    The current method of family planning is IUD.    Exercising: Yes.    walking Smoker:  no  Health Maintenance: Pap:  ASCUS HPV HR neg, 10-12-13 EC neg MMG: 04-18-14 category b density,birads category 1:neg Colonoscopy:  none BMD:   none TDaP:  2012 Labs: Poct urine-neg Self breast exam: done occ   reports that she has quit smoking. She does not have any smokeless tobacco history on file. She reports that she drinks alcohol. She reports that she does not use illicit drugs.  Past Medical History  Diagnosis Date  . Condyloma   . Migraines     chronic,no aura  . IBS (irritable bowel syndrome)   . 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 Intrauterine device (iud) insertion      inserted in 06,removed, then new one inserted 4/11  . Breast enhancement surgery    . Tonsillectomy and adenoidectomy      Current Outpatient Prescriptions  Medication Sig Dispense Refill  . levonorgestrel (MIRENA) 20 MCG/24HR IUD 1 each by Intrauterine route once.    . Multiple Vitamins-Minerals (MULTIVITAMIN PO) Take by mouth as needed.    . topiramate (TOPAMAX) 100 MG tablet daily.    . Cyclobenzaprine HCl (FLEXERIL PO) Take by mouth as needed.    . meclizine (ANTIVERT) 25 MG tablet Take 25 mg by mouth every 8 (eight) hours as needed.  1  . naproxen (NAPROSYN) 500 MG tablet      No current facility-administered medications for this visit.    Family History  Problem Relation Age of Onset  .  Hypertension Brother   . Diabetes Mother     ROS:  Pertinent items are noted in HPI.  Otherwise, a comprehensive ROS was negative.  Exam:   BP 108/72 mmHg  Pulse 72  Resp 16  Ht 5' 8.25" (1.734 m)  Wt 259 lb (117.482 kg)  BMI 39.07 kg/m2 Height: 5' 8.25" (173.4 cm)  Ht Readings from Last 3 Encounters:  11/13/14 5' 8.25" (1.734 m)  09/29/14 5\' 9"  (1.753 m)  10/12/13 5' 8.25" (1.734 m)    General appearance: alert, cooperative and appears stated age Head: Normocephalic, without obvious abnormality, atraumatic Neck: no adenopathy, supple, symmetrical, trachea midline and thyroid normal to inspection and palpation Lungs: clear to auscultation bilaterally Breasts: normal appearance, no masses or tenderness, No nipple retraction or dimpling, No nipple discharge or bleeding, No axillary or supraclavicular adenopathy Heart: regular rate and rhythm Abdomen: soft, non-tender; no masses,  no organomegaly Extremities: extremities normal, atraumatic, no cyanosis or edema Skin: Skin color, texture, turgor normal. No rashes or lesions Lymph nodes: Cervical, supraclavicular, and axillary nodes normal. No abnormal inguinal nodes palpated Neurologic: Grossly normal   Pelvic: External genitalia:  no lesions              Urethra:  normal appearing urethra with no masses, tenderness or lesions  Bartholin's and Skene's: normal                 Vagina: normal appearing vagina with normal color and discharge, no lesions              Cervix: normal, non tender, no lesions, IUD string noted              Pap taken: Yes.   Bimanual Exam:  Uterus:  normal size, contour, position, consistency, mobility, non-tender and mid position              Adnexa: normal adnexa and no mass, fullness, tenderness               Rectovaginal: Confirms               Anus:  normal sphincter tone, no lesions  A:  Well Woman with normal exam  Contraception Mirena IUD due for removal 03/07/15  P:   Reviewed  health and wellness pertinent to exam  Patient plans removal and reinsertion of new Mirena IUD. Instructed patient to call one month prior to expiration date for insurance precert and scheduling with MD..  Pap smear taken today with HPVHR as follow up from abnormal of ASCUS. If negative repeat one year, if not per results.  counseled on breast self exam, mammography screening, adequate intake of calcium and vitamin D, diet and exercise, weight loss  return annually or prn  An After Visit Summary was printed and given to the patient.

## 2014-11-13 NOTE — Patient Instructions (Signed)

## 2014-11-13 NOTE — Progress Notes (Signed)
Reviewed personally.  M. Suzanne Katalin Colledge, MD.  

## 2014-11-15 LAB — IPS PAP TEST WITH HPV

## 2014-11-28 ENCOUNTER — Ambulatory Visit
Admission: RE | Admit: 2014-11-28 | Discharge: 2014-11-28 | Disposition: A | Discharge status: Home / Self Care | Payer: 59 | Source: Ambulatory Visit | Attending: Orthopedic Surgery | Admitting: Orthopedic Surgery

## 2014-11-28 DIAGNOSIS — M545 Low back pain, unspecified: Secondary | ICD-10-CM

## 2014-11-28 DIAGNOSIS — G8929 Other chronic pain: Secondary | ICD-10-CM

## 2014-11-28 MED ORDER — METHYLPREDNISOLONE ACETATE 40 MG/ML INJ SUSP (RADIOLOG
120.0000 mg | Freq: Once | INTRAMUSCULAR | Status: AC
  Administered 2014-11-28: 120 mg via EPIDURAL

## 2014-11-28 MED ORDER — IOHEXOL 180 MG/ML  SOLN
1.0000 mL | Freq: Once | INTRAMUSCULAR | Status: AC | PRN
Start: 2014-11-28 — End: 2014-11-28
  Administered 2014-11-28: 1 mL via EPIDURAL

## 2015-02-20 ENCOUNTER — Other Ambulatory Visit: Payer: Self-pay | Admitting: Orthopedic Surgery

## 2015-02-20 DIAGNOSIS — M545 Low back pain: Secondary | ICD-10-CM

## 2015-02-27 ENCOUNTER — Ambulatory Visit
Admission: RE | Admit: 2015-02-27 | Discharge: 2015-02-27 | Disposition: A | Discharge status: Home / Self Care | Payer: 59 | Source: Ambulatory Visit | Attending: Orthopedic Surgery | Admitting: Orthopedic Surgery

## 2015-02-27 DIAGNOSIS — M545 Low back pain: Secondary | ICD-10-CM

## 2015-02-27 MED ORDER — METHYLPREDNISOLONE ACETATE 40 MG/ML INJ SUSP (RADIOLOG
120.0000 mg | Freq: Once | INTRAMUSCULAR | Status: AC
  Administered 2015-02-27: 120 mg via EPIDURAL

## 2015-02-27 MED ORDER — IOHEXOL 180 MG/ML  SOLN
1.0000 mL | Freq: Once | INTRAMUSCULAR | Status: AC | PRN
  Administered 2015-02-27: 1 mL via EPIDURAL

## 2015-03-16 ENCOUNTER — Other Ambulatory Visit: Payer: Self-pay | Admitting: Orthopedic Surgery

## 2015-03-16 DIAGNOSIS — M5136 Other intervertebral disc degeneration, lumbar region: Secondary | ICD-10-CM

## 2015-03-19 ENCOUNTER — Other Ambulatory Visit: Payer: Self-pay

## 2015-03-19 DIAGNOSIS — Z1231 Encounter for screening mammogram for malignant neoplasm of breast: Secondary | ICD-10-CM

## 2015-03-22 ENCOUNTER — Ambulatory Visit
Admission: RE | Admit: 2015-03-22 | Discharge: 2015-03-22 | Disposition: A | Discharge status: Home / Self Care | Payer: 59 | Source: Ambulatory Visit | Attending: Orthopedic Surgery | Admitting: Orthopedic Surgery

## 2015-03-22 ENCOUNTER — Other Ambulatory Visit: Payer: Self-pay | Admitting: Orthopedic Surgery

## 2015-03-22 DIAGNOSIS — M5136 Other intervertebral disc degeneration, lumbar region: Secondary | ICD-10-CM

## 2015-03-22 MED ORDER — IOHEXOL 180 MG/ML  SOLN
1.0000 mL | Freq: Once | INTRAMUSCULAR | Status: AC | PRN
  Administered 2015-03-22: 1 mL via EPIDURAL

## 2015-03-22 MED ORDER — METHYLPREDNISOLONE ACETATE 40 MG/ML INJ SUSP (RADIOLOG
120.0000 mg | Freq: Once | INTRAMUSCULAR | Status: AC
  Administered 2015-03-22: 120 mg via EPIDURAL

## 2015-05-07 ENCOUNTER — Ambulatory Visit
Admission: RE | Admit: 2015-05-07 | Discharge: 2015-05-07 | Disposition: A | Discharge status: Home / Self Care | Payer: 59 | Source: Ambulatory Visit

## 2015-05-07 DIAGNOSIS — Z1231 Encounter for screening mammogram for malignant neoplasm of breast: Secondary | ICD-10-CM

## 2015-10-02 ENCOUNTER — Other Ambulatory Visit: Payer: Self-pay | Admitting: Orthopedic Surgery

## 2015-10-02 DIAGNOSIS — M5136 Other intervertebral disc degeneration, lumbar region: Secondary | ICD-10-CM

## 2015-10-23 ENCOUNTER — Ambulatory Visit
Admission: RE | Admit: 2015-10-23 | Discharge: 2015-10-23 | Disposition: A | Discharge status: Home / Self Care | Payer: 59 | Source: Ambulatory Visit | Attending: Orthopedic Surgery | Admitting: Orthopedic Surgery

## 2015-10-23 DIAGNOSIS — M5136 Other intervertebral disc degeneration, lumbar region: Secondary | ICD-10-CM

## 2015-10-23 MED ORDER — METHYLPREDNISOLONE ACETATE 40 MG/ML INJ SUSP (RADIOLOG
120.0000 mg | Freq: Once | INTRAMUSCULAR | Status: AC
  Administered 2015-10-23: 120 mg via INTRA_ARTICULAR

## 2015-10-23 MED ORDER — IOHEXOL 180 MG/ML  SOLN
1.0000 mL | Freq: Once | INTRAMUSCULAR | Status: AC | PRN
  Administered 2015-10-23: 1 mL via INTRA_ARTICULAR

## 2015-10-29 ENCOUNTER — Other Ambulatory Visit: Payer: Self-pay | Admitting: Orthopedic Surgery

## 2015-10-29 DIAGNOSIS — M5136 Other intervertebral disc degeneration, lumbar region: Secondary | ICD-10-CM

## 2015-11-05 ENCOUNTER — Ambulatory Visit
Admission: RE | Admit: 2015-11-05 | Discharge: 2015-11-05 | Disposition: A | Discharge status: Home / Self Care | Payer: 59 | Source: Ambulatory Visit | Attending: Orthopedic Surgery | Admitting: Orthopedic Surgery

## 2015-11-05 DIAGNOSIS — M5136 Other intervertebral disc degeneration, lumbar region: Secondary | ICD-10-CM

## 2015-11-05 MED ORDER — IOHEXOL 180 MG/ML  SOLN
1.0000 mL | Freq: Once | INTRAMUSCULAR | Status: AC | PRN
  Administered 2015-11-05: 1 mL via EPIDURAL

## 2015-11-05 MED ORDER — METHYLPREDNISOLONE ACETATE 40 MG/ML INJ SUSP (RADIOLOG
120.0000 mg | Freq: Once | INTRAMUSCULAR | Status: AC
  Administered 2015-11-05: 120 mg via EPIDURAL

## 2015-11-28 ENCOUNTER — Other Ambulatory Visit: Payer: Self-pay | Admitting: Certified Nurse Midwife

## 2015-11-28 ENCOUNTER — Encounter: Payer: Self-pay | Admitting: Certified Nurse Midwife

## 2015-11-28 ENCOUNTER — Ambulatory Visit (INDEPENDENT_AMBULATORY_CARE_PROVIDER_SITE_OTHER): Payer: 59 | Admitting: Certified Nurse Midwife

## 2015-11-28 VITALS — BP 110/68 | HR 80 | Resp 16 | Ht 68.0 in | Wt 269.0 lb

## 2015-11-28 DIAGNOSIS — N912 Amenorrhea, unspecified: Secondary | ICD-10-CM

## 2015-11-28 DIAGNOSIS — Z0000 Encounter for general adult medical examination without abnormal findings: Secondary | ICD-10-CM

## 2015-11-28 DIAGNOSIS — Z124 Encounter for screening for malignant neoplasm of cervix: Secondary | ICD-10-CM

## 2015-11-28 DIAGNOSIS — Z01419 Encounter for gynecological examination (general) (routine) without abnormal findings: Secondary | ICD-10-CM

## 2015-11-28 DIAGNOSIS — Z30433 Encounter for removal and reinsertion of intrauterine contraceptive device: Secondary | ICD-10-CM

## 2015-11-28 LAB — HEMOGLOBIN, FINGERSTICK: HEMOGLOBIN, FINGERSTICK: 13.2 g/dL (ref 12.0–16.0)

## 2015-11-28 LAB — POCT URINALYSIS DIPSTICK
Bilirubin, UA: NEGATIVE
Blood, UA: NEGATIVE
Glucose, UA: NEGATIVE
KETONES UA: NEGATIVE
LEUKOCYTES UA: NEGATIVE
Nitrite, UA: NEGATIVE
PROTEIN UA: NEGATIVE
UROBILINOGEN UA: NEGATIVE
pH, UA: 6.5

## 2015-11-28 LAB — TSH: TSH: 2.664 u[IU]/mL (ref 0.350–4.500)

## 2015-11-28 LAB — HCG, SERUM, QUALITATIVE: PREG SERUM: NEGATIVE

## 2015-11-28 NOTE — Progress Notes (Signed)
Reviewed personally.  M. Suzanne Kobie Whidby, MD.  

## 2015-11-28 NOTE — Progress Notes (Signed)
Patient ID: Samantha Bird, female   DOB: October 22, 1971, 45 y.o.   MRN: [id number] 45 y.o. G0P0000 Divorced  Caucasian Fe here for annual exam. Periods none with Mirena IUD. Was due for replacement  4/16 and desires removal and replacement. Patient forgot to call and come in . Not sexually active in past 6 months. Desires STD screening. Sees Dr. Modesto CharonWong for medication management of headaches and prn. Has  Noted some fluid retention of hands and has increased water and decreased salt intake. Screening labs requested. No other health concerns today.  No LMP recorded. Patient is not currently having periods (Reason: IUD).          Sexually active: Yes.    Not sexually since 6/16. The current method of family planning is IUD.    Exercising: No.  The patient does not participate in regular exercise at present. 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 She has never used smokeless tobacco. She reports that she drinks about 1.2 - 1.8 oz of alcohol per week. She reports that she does not use illicit drugs.  Past Medical History  Diagnosis Date  . Condyloma   . Migraines     chronic,no aura  . IBS (irritable bowel syndrome)   . 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 Intrauterine device (iud) insertion      inserted in 06,removed, then new one inserted 4/11  . Breast enhancement surgery    . Tonsillectomy and adenoidectomy      Current Outpatient Prescriptions  Medication Sig Dispense Refill  . Cyclobenzaprine HCl  (FLEXERIL PO) Take by mouth as needed.    Marland Kitchen. levonorgestrel (MIRENA) 20 MCG/24HR IUD 1 each by Intrauterine route once.    . Multiple Vitamins-Minerals (MULTIVITAMIN PO) Take by mouth as needed.    . naproxen (NAPROSYN) 500 MG tablet     . topiramate (TOPAMAX) 100 MG tablet daily.     No current facility-administered medications for this visit.    Family History  Problem Relation Age of Onset  . Hypertension Brother   . Diabetes Mother     ROS:  Pertinent items are noted in HPI.  Otherwise, a comprehensive ROS was negative.  Exam:   BP 110/68 mmHg  Pulse 80  Resp 16  Ht 5\' 8"  (1.727 m)  Wt 269 lb (122.018 kg)  BMI 40.91 kg/m2 Height: 5\' 8"  (172.7 cm) Ht Readings from Last 3 Encounters:  11/28/15 5\' 8"  (1.727 m)  11/13/14 5' 8.25" (1.734 m)  09/29/14 5\' 9"  (1.753 m)    General appearance: alert, cooperative and appears stated age Head: Normocephalic, without obvious abnormality, atraumatic Neck: no adenopathy, supple, symmetrical, trachea midline and thyroid normal to inspection and palpation Lungs: clear to auscultation bilaterally Breasts: normal appearance, no masses or tenderness, No nipple retraction or dimpling, No nipple discharge or bleeding, No axillary or supraclavicular adenopathy Heart: regular rate and rhythm Abdomen: soft, non-tender; no masses,  no  organomegaly Extremities: extremities normal, atraumatic, no cyanosis or edema Skin: Skin color, texture, turgor normal. No rashes or lesions Lymph nodes: Cervical, supraclavicular, and axillary nodes normal. No abnormal inguinal nodes palpated Neurologic: Grossly normal   Pelvic: External genitalia:  no lesions              Urethra:  normal appearing urethra with no masses, tenderness or lesions              Bartholin's and Skene's: normal                 Vagina: normal appearing vagina with normal color and discharge, no lesions              Cervix: normal,no tenderness or lesions, IUD string noted in cervix,  nullparous os              Pap taken: Yes.   Bimanual Exam:  Uterus:  normal size, contour, position, consistency, mobility, non-tender              Adnexa: normal adnexa and no mass, fullness, tenderness no large masses, limited by body habitus               Rectovaginal: Confirms               Anus:  normal sphincter tone, no lesions  Chaperone present: yes  A:  Well Woman with normal exam  Contraception Mirena IUD due for replacement 4/16, desires removal and replacement  History of abnormal pap smear ASCUS + HPVHR  2nd pap after colpo today for follow up  Screening labs  History of migraine headaches with MD management  P:   Reviewed health and wellness pertinent to exam  Discussed with patient need for UPT even though not sexually active, agreeable. Patient plans to remain not sexually active until after replacement. Patient will be called with insurance information and scheduled. Discussed Cytotec use night before and am of insertion, to help with easier insertion. Questions addressed. Aware MD will insert due to nullparity  If pap negative will return to normal screening protocol.  Labs: CMP, Lipid panel, TSH, Vit. D, Hep C, HIV, HCG qual.,Gc,Chlamydia  Continue MD follow up as indicated for headaches.  Pap smear as above with HPV reflex   Reviewed importance of SBE and mammogram yearly, dietary needs of calcium and vit. D. Discussed weight loss and exercise to decrease risk of other health issues.   return annually or prn  An After Visit Summary was printed and given to the patient.

## 2015-11-28 NOTE — Patient Instructions (Signed)

## 2015-11-29 LAB — COMPREHENSIVE METABOLIC PANEL
ALK PHOS: 76 U/L (ref 33–115)
ALT: 33 U/L — AB (ref 6–29)
AST: 18 U/L (ref 10–35)
Albumin: 4.1 g/dL (ref 3.6–5.1)
BUN: 15 mg/dL (ref 7–25)
CALCIUM: 8.9 mg/dL (ref 8.6–10.2)
CHLORIDE: 108 mmol/L (ref 98–110)
CO2: 21 mmol/L (ref 20–31)
Creat: 0.92 mg/dL (ref 0.50–1.10)
Glucose, Bld: 98 mg/dL (ref 65–99)
POTASSIUM: 4.1 mmol/L (ref 3.5–5.3)
Sodium: 140 mmol/L (ref 135–146)
TOTAL PROTEIN: 6.8 g/dL (ref 6.1–8.1)
Total Bilirubin: 0.4 mg/dL (ref 0.2–1.2)

## 2015-11-29 LAB — HIV ANTIBODY (ROUTINE TESTING W REFLEX): HIV 1&2 Ab, 4th Generation: NONREACTIVE

## 2015-11-29 LAB — LIPID PANEL
CHOL/HDL RATIO: 3.7 ratio (ref <=5.0)
Cholesterol: 224 mg/dL — ABNORMAL HIGH (ref 125–200)
HDL: 61 mg/dL (ref >=46)
LDL Cholesterol: 128 mg/dL (ref <130)
TRIGLYCERIDES: 175 mg/dL — AB (ref <150)
VLDL: 35 mg/dL — ABNORMAL HIGH (ref <30)

## 2015-11-29 LAB — VITAMIN D 25 HYDROXY (VIT D DEFICIENCY, FRACTURES): Vit D, 25-Hydroxy: 19 ng/mL — ABNORMAL LOW (ref 30–100)

## 2015-11-29 LAB — HEPATITIS C ANTIBODY: HCV AB: NEGATIVE

## 2015-11-30 ENCOUNTER — Other Ambulatory Visit: Payer: Self-pay | Admitting: Certified Nurse Midwife

## 2015-11-30 DIAGNOSIS — R899 Unspecified abnormal finding in specimens from other organs, systems and tissues: Secondary | ICD-10-CM

## 2015-11-30 LAB — IPS PAP TEST WITH REFLEX TO HPV

## 2015-11-30 LAB — HEMOGLOBIN A1C
HEMOGLOBIN A1C: 5.9 % — AB (ref <5.7)
MEAN PLASMA GLUCOSE: 123 mg/dL — AB (ref <117)

## 2015-11-30 LAB — IPS N GONORRHOEA AND CHLAMYDIA BY PCR

## 2015-12-05 ENCOUNTER — Telehealth: Payer: Self-pay | Admitting: Certified Nurse Midwife

## 2015-12-05 NOTE — Telephone Encounter (Signed)
Spoke with pt regarding benefit for removal and reinsertion of Mirena. Patient understood and agreeable. Patient ready to schedule. Forward to clinical to schedule

## 2015-12-05 NOTE — Telephone Encounter (Signed)
Left message to call Alannis Hsia at [phone]. 

## 2015-12-05 NOTE — Telephone Encounter (Signed)
Called patient to discuss benefits for a procedure. Left Voicemail requesting a call. °

## 2015-12-07 MED ORDER — MISOPROSTOL 200 MCG PO TABS: ORAL_TABLET | ORAL | Status: DC

## 2015-12-07 NOTE — Telephone Encounter (Signed)
Spoke with patient. Patient has not been sexually active in 6 months. Serum pregnancy test was negative here in office on 11/28/2015. Denies having sexual intercourse since her appointment. Appointment for IUD removal and reinsertion scheduled for 12/14/2015 at 10 am with Dr.Miller. Patient is agreeable to date and time. Pre procedure instructions given.  Motrin instructions given. Motrin=Advil=Ibuprofen, 800 mg one hour before appointment. Eat a meal and hydrate well before appointment. Cytotec instructions given. Place 1 tablet PV night before the procedure. 1 tablet PV the morning of the procedure. Rx for Cytotec 200 mcg #2 0RF sent to pharmacy on file.   Routing to provider for final review. Patient agreeable to disposition. Will close encounter.

## 2015-12-14 ENCOUNTER — Ambulatory Visit (INDEPENDENT_AMBULATORY_CARE_PROVIDER_SITE_OTHER): Payer: 59 | Admitting: Obstetrics & Gynecology

## 2015-12-14 ENCOUNTER — Encounter: Payer: Self-pay | Admitting: Obstetrics & Gynecology

## 2015-12-14 VITALS — BP 112/70 | HR 104 | Resp 16 | Ht 68.0 in | Wt 269.0 lb

## 2015-12-14 DIAGNOSIS — Z30433 Encounter for removal and reinsertion of intrauterine contraceptive device: Secondary | ICD-10-CM

## 2015-12-14 DIAGNOSIS — Z975 Presence of (intrauterine) contraceptive device: Secondary | ICD-10-CM | POA: Insufficient documentation

## 2015-12-14 NOTE — Progress Notes (Signed)
45 y.o. G3P0000 Divorced Caucasian female presents for removal of and insertion of Mirena.  This is her third Mirena IUD.  She uses this for contraception, when sexually active, and for bleeding control.  She is not interested in any other form of contraception.  She has decided to proceed with IUD placement today.  Currently, she denies any vaginal symptoms or STD concerns.  LMP:  No LMP recorded. Patient is not currently having periods (Reason: IUD).  Gen:  WNWF healthy female NAD Abdomen: soft, non-tender Groin:  no inguinal nodes palpated  Pelvic exam: Vulva:  normal Vagina:  normal vagina Cervix:  Non-tender, Negative CMT, no lesions or redness Uterus:  normal shape, position and consistency   After patient read information booklet and all questions were answered, informed consent was obtained.      Procedure:  Speculum inserted into vagina. Cervix visualized and cleansed with betadine solution X 3. Paracervical block placed:  yes, 1% Lidocaine, Lot: [id number], exp 01/22/17.  After paracervical block was placed, Mirena IUD was removed by grasping string with ringed forceps.  IUD was removed with one pull.  Pt did not want to see the prior IUD.  This was discarded.  Then tenaculum placed on cervix at 12 o'clock position.  Uterus sounded to 7.5 centimeters.  IUD and inserting device removed from sterile packet and under sterile conditions inserted to fundus of uterus.  IUD released and introducer removed without difficulty.  IUD string trimmed to 2 centimeters.  Remainder string given to patient to feel for identification.  Tenaculum removed.  Minimal bleeding noted.  Speculum removed.  Uterus palpated normal.  Patient tolerated procedure well.  IUD Lot #:[id number].  Exp: 5/19.  Package information attached to consent and scanned into EPIC.  A: Removal of prior IUD and insertion of Mirena IUD (her third)   P: Return to office 6-8 weeks for recheck      Pt knows IUD needs to be replaced  approximately 5 years, no later than 12/13/20.  Instructions provided.

## 2015-12-17 ENCOUNTER — Telehealth: Payer: Self-pay | Admitting: Obstetrics & Gynecology

## 2015-12-17 DIAGNOSIS — Z30431 Encounter for routine checking of intrauterine contraceptive device: Secondary | ICD-10-CM

## 2015-12-17 NOTE — Telephone Encounter (Signed)
I agree that these are not typical symptoms.  Should check placement with PUS.  Please schedule for Thursday, if possible.

## 2015-12-17 NOTE — Telephone Encounter (Signed)
Patient calling requesting to speak with the nurse. She said, "I had a procedure (IUD removal/insertion) on Friday, 12/14/15, and I am still not feeling well. I'd just like to speak with the nurse to see if this is normal. I am having nausea and I threw up twice over the weekend."

## 2015-12-17 NOTE — Telephone Encounter (Signed)
Return call to patient. She is given message from Dr. Hyacinth Meeker.  She states she feels slightly improved but nausea remains. She is agreeable to schedule Pelvic ultrasound with Dr. Hyacinth Meeker on Thursday 01/20/16  Order placed for pre-cert and patient contact.  Patient advised to call back any time with any concerns or increase in symptoms. Patient verbalized understanding and will follow up as scheduled or earlier as needed.  cc Harland Dingwall for insurance pre-certification and patient contact.

## 2015-12-17 NOTE — Telephone Encounter (Signed)
Call to patient. IUD placed Friday with Dr. Hyacinth Meeker. Patients major complaint is feeling of nausea and bloating of abdomen and intermittent cramping. She states "I just feel green."  No sick contacts that she is aware of. Has had recent URI and completed Z-pack yesterday. She states she has vomited x 1 on Saturday. No other episodes of vomiting, just reports ongoing nausea. Patient denies any vaginal bleeding, no fevers or abnormal vaginal discharge. Taking Motrin 800 mg po with relief of symptoms. She feels that she is feeling better since Friday and symptoms have decreased but expresses concern that they still continue and then states "I am not super worried, but I wanted to know if this is normal."   Today, she is at work. Declines appointment due to recent illness and she works in [place]. She states she is calling for Dr. Rondel Baton opinion on her symptoms as this is her third IUD and that she does remember feeling this way after prior insertions.  Advised will send message to Dr. Hyacinth Meeker and return call with response. Patient agreeable.

## 2015-12-18 ENCOUNTER — Telehealth: Payer: Self-pay | Admitting: Obstetrics & Gynecology

## 2015-12-18 NOTE — Telephone Encounter (Signed)
Spoke with pt regarding benefit for ultrasound. Patient understood and agreeable.  Patient is scheduled on 12/20/15 with Dr. Hyacinth Meeker. Pt aware of arrival date and time. Pt aware of 72 hours cancellation policy with $100 fee. No further questions. Ok to close

## 2015-12-20 ENCOUNTER — Ambulatory Visit (INDEPENDENT_AMBULATORY_CARE_PROVIDER_SITE_OTHER): Payer: 59

## 2015-12-20 ENCOUNTER — Other Ambulatory Visit: Payer: 59 | Admitting: Obstetrics & Gynecology

## 2015-12-20 ENCOUNTER — Ambulatory Visit (INDEPENDENT_AMBULATORY_CARE_PROVIDER_SITE_OTHER): Payer: 59 | Admitting: Obstetrics & Gynecology

## 2015-12-20 ENCOUNTER — Other Ambulatory Visit: Payer: 59

## 2015-12-20 VITALS — BP 116/80 | HR 88 | Resp 16 | Ht 68.0 in | Wt 269.0 lb

## 2015-12-20 DIAGNOSIS — Z30431 Encounter for routine checking of intrauterine contraceptive device: Secondary | ICD-10-CM

## 2015-12-20 DIAGNOSIS — R112 Nausea with vomiting, unspecified: Secondary | ICD-10-CM | POA: Diagnosis not present

## 2015-12-20 DIAGNOSIS — R899 Unspecified abnormal finding in specimens from other organs, systems and tissues: Secondary | ICD-10-CM

## 2015-12-20 LAB — HEPATIC FUNCTION PANEL
ALBUMIN: 4.2 g/dL (ref 3.6–5.1)
ALT: 23 U/L (ref 6–29)
AST: 16 U/L (ref 10–35)
Alkaline Phosphatase: 73 U/L (ref 33–115)
BILIRUBIN DIRECT: 0.1 mg/dL (ref <=0.2)
BILIRUBIN TOTAL: 0.4 mg/dL (ref 0.2–1.2)
Indirect Bilirubin: 0.3 mg/dL (ref 0.2–1.2)
Total Protein: 7 g/dL (ref 6.1–8.1)

## 2015-12-20 NOTE — Progress Notes (Signed)
45 yo G0 P0 DWF here for PUS after having IUD placed 12/14/15.  Pt had mirena placed about five years ago and did very well with this in the past.  When IUD was due for removal, she requested a repeat Mirena IUD.  Placement on 1/0/17 went very well and without difficulty.  However, a couple of days later she started to have nausea.  She called on 12/17/15 stating she just felt "green".  She had one episode fo emesis on 12/16/15.  She was also having some cramping.  She had minimal spotting.  Pt didn't have the nausea with the first Mirena and called for reassurance.  PUS recommended.  Pt is here for this today.  She states she feels much better and almost called to cancel the PUS yesterday but she felt she would be reassured more if everything was okay.  Reviewed with pt the nausea could have been due to the initially higher progesterone but this would be unusual so maybe it was viral.  She agrees and is just glad that it has resolved.   H/o mildly elevated ALT in early January.  Repeat will be done today.  Findings:  UTERUS: 6.7 x 3.7 x 3.4cm with IUD in correct location EMS: 3.75mm ADNEXA: Left ovary: 2.2 x 1.8 x 1.7cm with 1.3cm follicle       Right ovary: 1.9 x 1.2 x 1.4cm CUL DE SAC: no free fluid  Discussion:  Images reviewed with pt including placement of IUD.  Small left follicle noted but ovulation with IUD is common.  Otherwise, PUS is completely normal.  Pt pleased with findings.  Assessment:  Normal IUD placement Nausea that is significantly improved Emesis that has resolved Liver panel obtained today due to hx of mildly elevated ALT 11/28/15 and due to recent GI symptoms.  She will be notified of results.  Since symptoms are much improved, I do not feel any additional blood work is necessary.  Plan:  6 weeks follow up after IUD placement is canceled and location of IUD is good.  Pt will follow up for her AEX as previously scheduled.  ~15 minutes spent with patient >50% of time was in  face to face discussion of above.

## 2015-12-24 ENCOUNTER — Other Ambulatory Visit: Payer: 59

## 2015-12-30 ENCOUNTER — Encounter: Payer: Self-pay | Admitting: Obstetrics & Gynecology

## 2016-01-28 ENCOUNTER — Ambulatory Visit: Payer: 59 | Admitting: Obstetrics & Gynecology

## 2016-05-06 ENCOUNTER — Other Ambulatory Visit: Payer: Self-pay | Admitting: Orthopedic Surgery

## 2016-05-06 DIAGNOSIS — M5136 Other intervertebral disc degeneration, lumbar region: Secondary | ICD-10-CM

## 2016-05-19 ENCOUNTER — Other Ambulatory Visit: Payer: Self-pay | Admitting: Orthopedic Surgery

## 2016-05-19 ENCOUNTER — Ambulatory Visit
Admission: RE | Admit: 2016-05-19 | Discharge: 2016-05-19 | Disposition: A | Discharge status: Home / Self Care | Payer: 59 | Source: Ambulatory Visit | Attending: Orthopedic Surgery | Admitting: Orthopedic Surgery

## 2016-05-19 ENCOUNTER — Other Ambulatory Visit: Payer: 59

## 2016-05-19 DIAGNOSIS — M5136 Other intervertebral disc degeneration, lumbar region: Secondary | ICD-10-CM

## 2016-05-19 MED ORDER — IOPAMIDOL (ISOVUE-M 200) INJECTION 41%
1.0000 mL | Freq: Once | INTRAMUSCULAR | Status: AC
  Administered 2016-05-19: 1 mL via EPIDURAL

## 2016-05-19 MED ORDER — METHYLPREDNISOLONE ACETATE 40 MG/ML INJ SUSP (RADIOLOG
120.0000 mg | Freq: Once | INTRAMUSCULAR | Status: AC
  Administered 2016-05-19: 120 mg via EPIDURAL

## 2016-05-19 NOTE — Discharge Instructions (Signed)

## 2016-05-20 ENCOUNTER — Telehealth: Payer: Self-pay | Admitting: Radiology

## 2016-05-20 NOTE — Telephone Encounter (Signed)
Pt called c/o flushing, nausea and inability to sleep. Explained it could be side effects from the steroid injection she had yesterday. Pt will call if further problems.

## 2016-06-02 ENCOUNTER — Other Ambulatory Visit: Payer: 59

## 2016-06-02 ENCOUNTER — Telehealth: Payer: Self-pay | Admitting: Certified Nurse Midwife

## 2016-06-02 NOTE — Telephone Encounter (Signed)
Left message on voicemail to reschedule missed lab appointment. °

## 2016-06-04 NOTE — Telephone Encounter (Signed)
Will you keep this on your calendar she needed repeat labs due to elevation, to make sure she rescedules.

## 2016-06-04 NOTE — Telephone Encounter (Signed)
Will hold to see if appt scheduled

## 2016-06-09 ENCOUNTER — Other Ambulatory Visit: Payer: Self-pay | Admitting: Orthopedic Surgery

## 2016-06-09 DIAGNOSIS — M5136 Other intervertebral disc degeneration, lumbar region: Secondary | ICD-10-CM

## 2016-06-11 NOTE — Telephone Encounter (Signed)
Left message to call back to schedule appt

## 2016-06-13 NOTE — Telephone Encounter (Signed)
Letter sent.

## 2016-06-16 ENCOUNTER — Ambulatory Visit
Admission: RE | Admit: 2016-06-16 | Discharge: 2016-06-16 | Disposition: A | Discharge status: Home / Self Care | Payer: 59 | Source: Ambulatory Visit | Attending: Orthopedic Surgery | Admitting: Orthopedic Surgery

## 2016-06-16 DIAGNOSIS — M5136 Other intervertebral disc degeneration, lumbar region: Secondary | ICD-10-CM

## 2016-06-16 MED ORDER — METHYLPREDNISOLONE ACETATE 40 MG/ML INJ SUSP (RADIOLOG
120.0000 mg | Freq: Once | INTRAMUSCULAR | Status: AC
  Administered 2016-06-16: 120 mg via EPIDURAL

## 2016-06-16 MED ORDER — IOPAMIDOL (ISOVUE-M 200) INJECTION 41%
1.0000 mL | Freq: Once | INTRAMUSCULAR | Status: AC
  Administered 2016-06-16: 1 mL via EPIDURAL

## 2016-06-18 ENCOUNTER — Other Ambulatory Visit: Payer: Self-pay | Admitting: Certified Nurse Midwife

## 2016-06-18 DIAGNOSIS — Z1231 Encounter for screening mammogram for malignant neoplasm of breast: Secondary | ICD-10-CM

## 2016-06-18 DIAGNOSIS — Z9882 Breast implant status: Secondary | ICD-10-CM

## 2016-06-20 NOTE — Telephone Encounter (Signed)
After several calls & letter sent, no callback from patient to schedule appt. Please advise. Okay to close encounter?

## 2016-07-25 ENCOUNTER — Ambulatory Visit
Admission: RE | Admit: 2016-07-25 | Discharge: 2016-07-25 | Disposition: A | Discharge status: Home / Self Care | Payer: 59 | Source: Ambulatory Visit | Attending: Certified Nurse Midwife | Admitting: Certified Nurse Midwife

## 2016-07-25 DIAGNOSIS — Z1231 Encounter for screening mammogram for malignant neoplasm of breast: Secondary | ICD-10-CM

## 2016-07-25 DIAGNOSIS — Z9882 Breast implant status: Secondary | ICD-10-CM

## 2016-09-16 ENCOUNTER — Encounter: Payer: Self-pay | Admitting: Certified Nurse Midwife

## 2016-09-16 ENCOUNTER — Telehealth: Payer: Self-pay | Admitting: Certified Nurse Midwife

## 2016-09-16 ENCOUNTER — Ambulatory Visit (INDEPENDENT_AMBULATORY_CARE_PROVIDER_SITE_OTHER): Payer: 59 | Admitting: Certified Nurse Midwife

## 2016-09-16 VITALS — BP 118/70 | HR 70 | Resp 16 | Ht 68.0 in | Wt 275.0 lb

## 2016-09-16 DIAGNOSIS — B3731 Acute candidiasis of vulva and vagina: Secondary | ICD-10-CM

## 2016-09-16 DIAGNOSIS — B373 Candidiasis of vulva and vagina: Secondary | ICD-10-CM

## 2016-09-16 MED ORDER — TRIAMCINOLONE ACETONIDE 0.1 % EX CREA: 1.0000 "application " | TOPICAL_CREAM | Freq: Two times a day (BID) | CUTANEOUS | 0 refills | Status: DC

## 2016-09-16 MED ORDER — NYSTATIN 100000 UNIT/GM EX CREA: 1.0000 "application " | TOPICAL_CREAM | Freq: Two times a day (BID) | CUTANEOUS | 0 refills | Status: DC

## 2016-09-16 MED ORDER — NYSTATIN-TRIAMCINOLONE 100000-0.1 UNIT/GM-% EX OINT: 1.0000 "application " | TOPICAL_OINTMENT | Freq: Two times a day (BID) | CUTANEOUS | 0 refills | Status: DC

## 2016-09-16 NOTE — Patient Instructions (Signed)
Cutaneous Candidiasis °Cutaneous candidiasis is a condition in which there is an overgrowth of yeast (candida) on the skin. Yeast normally live on the skin, but in small enough numbers not to cause any symptoms. In certain cases, increased growth of the yeast may cause an actual yeast infection. This kind of infection usually occurs in areas of the skin that are constantly warm and moist, such as the armpits or the groin. Yeast is the most common cause of diaper rash in babies and in people who cannot control their bowel movements (incontinence). °CAUSES  °The fungus that most often causes cutaneous candidiasis is Candida albicans. Conditions that can increase the risk of getting a yeast infection of the skin include: °· Obesity. °· Pregnancy. °· Diabetes. °· Taking antibiotic medicine. °· Taking birth control pills. °· Taking steroid medicines. °· Thyroid disease. °· An iron or zinc deficiency. °· Problems with the immune system. °SYMPTOMS  °· Red, swollen area of the skin. °· Bumps on the skin. °· Itchiness. °DIAGNOSIS  °The diagnosis of cutaneous candidiasis is usually based on its appearance. Light scrapings of the skin may also be taken and viewed under a microscope to identify the presence of yeast. °TREATMENT  °Antifungal creams may be applied to the infected skin. In severe cases, oral medicines may be needed.  °HOME CARE INSTRUCTIONS  °· Keep your skin clean and dry. °· Maintain a healthy weight. °· If you have diabetes, keep your blood sugar under control. °SEEK IMMEDIATE MEDICAL CARE IF: °· Your rash continues to spread despite treatment. °· You have a fever, chills, or abdominal pain. °  °This information is not intended to replace advice given to you by your health care provider. Make sure you discuss any questions you have with your health care provider. °  °Document Released: 07/29/2011 Document Revised: 02/02/2012 Document Reviewed: 05/14/2015 °Elsevier Interactive Patient Education ©2016 Elsevier  Inc. ° °

## 2016-09-16 NOTE — Telephone Encounter (Signed)
Cindy @ [facility] called states the Mycolog is not covered by insurance and would like to speak with someone about changing it.  [phone]

## 2016-09-16 NOTE — Progress Notes (Signed)
45 y.o. Divorced Caucasian female G0P0000 here with complaint of vaginal symptoms of pain with sexual activity, describes sharp pain and gone quickly. When pain occurs she does have vaginal red bleeding. Notes with cleaning up, small amount. Does not persist. Denies new personal products or vaginal dryness. No sexual toys or hand stimulation. Also noted rectal bleeding one occurrence with no anal sexual activity. no STD concerns or screening needed.. Urinary symptoms no . Contraception  IUD, no issues.   O:Healthy female WDWN Affect: normal, orientation x 3  Exam:Skin warm and dry Abdomen:soft, non tender  Inguinal Lymph node: no enlargement or tenderness Pelvic exam: External genital: normal female, small cracking noted on vulva skin below clitoris with slight pink blood noted with q tip touch, patient " this the area of pain", wet prep taken of skin. No genital warts or blisters  noted BUS: negative Vagina: normal discharge noted.non tender, no blood noted  Affirm taken Cervix: normal, non tender, no CMT, no blood noted from cervix Uterus: normal, non tender Adnexa:normal, non tender, no masses or fullness noted Anal area: small cracking noted around anal opening with exudate, wet prep taken, slight pink blood with touch. No history of HSV and negative serology. Declines culture.   Wet Prep results: KOH,Saline Positive for yeast   A:Normal pelvic exam Yeast vulvitis and dermatitis   P:Discussed findings of yeast and etiology. Discussed Aveeno  sitz bath for comfort. Avoid moist clothes or pads for extended period of time. If working out in gym clothes  for long periods of time change underwear. Go with out underwear at hs if possible. Coconut Oil use for skin protection prior to activity can be used to external skin for protection or dryness. Questions addressed. Rx: Mycolog cream see order with instructions  Rv prn

## 2016-09-16 NOTE — Telephone Encounter (Signed)
Spoke with pharmacist Cindy at [facility]. Advised rx for Mycolog may be discontinued. Rx for Triamcinolone cream 0.1 % apply 1 application topically 2 times daily and Nystatin cream apply 1 application topically 2 times daily sent to pharmacy on file. Arline AspCindy states these have been received and she will fill these medications at this time. Call to patient advised that Mycolog is not covered by her insurance and that Nystatin cream and Triamcinolone cream have been sent to her pharmacy. Advised she will need to mix these two creams in a 1:1 ratio and apply the cream twice per day. Patient is agreeable and verbalizes understanding.  Routing to provider for final review. Patient agreeable to disposition. Will close encounter.

## 2016-09-16 NOTE — Telephone Encounter (Signed)
Spoke with patient. Patient states that she has been experiencing intermittent "shooting" pain vaginally. Having pain with intercourse. Has begun having light bleeding after intercourse. Denies any heavy bleeding. Denies any pain at this time. Patient is concerned as this has been occurring more frequently. Advised she will need to be seen in the office for further evaluation. Patient is agreeable. Appointment scheduled for today 09/16/2016 at 1:45 pm with Leota Sauerseborah Leonard CNM. Patient is agreeable to date and time.  Routing to provider for final review. Patient agreeable to disposition. Will close encounter.

## 2016-09-16 NOTE — Telephone Encounter (Signed)
Patient is having pain during intercourse. Patient is availabe for an appointment this afternoon if possible.

## 2016-09-17 LAB — WET PREP BY MOLECULAR PROBE
CANDIDA SPECIES: NEGATIVE
GARDNERELLA VAGINALIS: NEGATIVE
TRICHOMONAS VAG: NEGATIVE

## 2016-09-19 NOTE — Progress Notes (Signed)
Encounter reviewed Rasul Decola, MD   

## 2016-12-03 ENCOUNTER — Ambulatory Visit (INDEPENDENT_AMBULATORY_CARE_PROVIDER_SITE_OTHER): Payer: 59 | Admitting: Certified Nurse Midwife

## 2016-12-03 ENCOUNTER — Encounter: Payer: Self-pay | Admitting: Certified Nurse Midwife

## 2016-12-03 VITALS — BP 112/74 | HR 70 | Resp 16 | Ht 68.5 in | Wt 274.0 lb

## 2016-12-03 DIAGNOSIS — Z0000 Encounter for general adult medical examination without abnormal findings: Secondary | ICD-10-CM | POA: Diagnosis not present

## 2016-12-03 DIAGNOSIS — Z30431 Encounter for routine checking of intrauterine contraceptive device: Secondary | ICD-10-CM

## 2016-12-03 DIAGNOSIS — Z01419 Encounter for gynecological examination (general) (routine) without abnormal findings: Secondary | ICD-10-CM | POA: Diagnosis not present

## 2016-12-03 LAB — POCT URINALYSIS DIPSTICK
BILIRUBIN UA: NEGATIVE
GLUCOSE UA: NEGATIVE
KETONES UA: NEGATIVE
LEUKOCYTES UA: NEGATIVE
Nitrite, UA: NEGATIVE
Protein, UA: NEGATIVE
Urobilinogen, UA: NEGATIVE
pH, UA: 5

## 2016-12-03 LAB — COMPREHENSIVE METABOLIC PANEL
ALBUMIN: 3.9 g/dL (ref 3.6–5.1)
ALK PHOS: 62 U/L (ref 33–115)
ALT: 26 U/L (ref 6–29)
AST: 22 U/L (ref 10–35)
BUN: 19 mg/dL (ref 7–25)
CHLORIDE: 108 mmol/L (ref 98–110)
CO2: 24 mmol/L (ref 20–31)
CREATININE: 0.9 mg/dL (ref 0.50–1.10)
Calcium: 9.1 mg/dL (ref 8.6–10.2)
Glucose, Bld: 82 mg/dL (ref 65–99)
POTASSIUM: 4.2 mmol/L (ref 3.5–5.3)
Sodium: 141 mmol/L (ref 135–146)
TOTAL PROTEIN: 6.8 g/dL (ref 6.1–8.1)
Total Bilirubin: 0.4 mg/dL (ref 0.2–1.2)

## 2016-12-03 LAB — HEMOGLOBIN A1C
Hgb A1c MFr Bld: 5.4 % (ref <5.7)
Mean Plasma Glucose: 108 mg/dL

## 2016-12-03 LAB — LIPID PANEL
CHOL/HDL RATIO: 4.7 ratio (ref <5.0)
CHOLESTEROL: 201 mg/dL — AB (ref <200)
HDL: 43 mg/dL — ABNORMAL LOW (ref >50)
LDL Cholesterol: 107 mg/dL — ABNORMAL HIGH (ref <100)
Triglycerides: 254 mg/dL — ABNORMAL HIGH (ref <150)
VLDL: 51 mg/dL — AB (ref <30)

## 2016-12-03 LAB — TSH: TSH: 1.28 m[IU]/L

## 2016-12-03 NOTE — Progress Notes (Signed)
46 y.o. G0P0000 Divorced  Caucasian Fe here for annual exam. Periods scant to spotting with Mirena IUD. IUD inserted 12/14/15. Now working on weight loss again and feels like she is "getting no where at this point", but has a partner who cooks clean diet for her and this is helping. She is working on weight control. Sees PCP as needed. Dermatology manages psoriasis with recent flare. Now using Clobetasol cream with good results. No STD concerns or testing needed. No other health issues today.  No LMP recorded. Patient is not currently having periods (Reason: IUD).          Sexually active: Yes.    The current method of family planning is IUD.    Exercising: No.  exercise Smoker:  no  Health Maintenance: Pap:  11-28-15 neg Abnormal history: previous ASCUS 2014 with neg.  HPVHR, colpo ECC benign,  glandular , 2013, ASCUS + HPVHR MMG:  07-25-16 category b density birads 1:neg Colonoscopy:  none BMD:   None  TDaP:  2012 Shingles: no Pneumonia: no Hep C and HIV: both neg 2017 Labs: hgb-13.4 Self breast exam: done occ   reports that she has quit smoking. She has never used smokeless tobacco. She reports that she drinks about 0.6 - 1.2 oz of alcohol per week . She reports that she does not use drugs.  Past Medical History:  Diagnosis Date  . 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 Condyloma   . IBS (irritable bowel syndrome)   . Migraines    chronic,no aura    Past Surgical History:  Procedure Laterality Date  . BREAST ENHANCEMENT SURGERY    . INTRAUTERINE DEVICE (IUD) INSERTION     inserted in 06,removed, then new one inserted 4/11  . TONSILLECTOMY AND ADENOIDECTOMY      Current Outpatient Prescriptions  Medication Sig Dispense Refill  . clobetasol ointment (TEMOVATE) 0.05 %     . Cyclobenzaprine HCl (FLEXERIL PO) Take by mouth as needed. Reported on 12/20/2015    . levonorgestrel (MIRENA) 20 MCG/24HR IUD 1 each by Intrauterine route once.    . Multiple  Vitamins-Minerals (MULTIVITAMIN PO) Take by mouth as needed.    . naproxen (NAPROSYN) 500 MG tablet Reported on 12/14/2015    . topiramate (TOPAMAX) 100 MG tablet daily.     No current facility-administered medications for this visit.     Family History  Problem Relation Age of Onset  . Hypertension Brother   . Diabetes Mother   . Heart attack Father     ROS:  Pertinent items are noted in HPI.  Otherwise, a comprehensive ROS was negative.  Exam:   BP 112/74   Pulse 70   Resp 16   Ht 5' 8.5" (1.74 m)   Wt 274 lb (124.3 kg)   BMI 41.06 kg/m  Height: 5' 8.5" (174 cm) Ht Readings from Last 3 Encounters:  12/03/16 5' 8.5" (1.74 m)  09/16/16 5\' 8"  (1.727 m)  12/20/15 5\' 8"  (1.727 m)    General appearance: alert, cooperative and appears stated age Head: Normocephalic, without obvious abnormality, atraumatic Neck: no adenopathy, supple, symmetrical, trachea midline and thyroid normal to inspection and palpation Lungs: clear to auscultation bilaterally Breasts: normal appearance, no masses or tenderness, No nipple retraction or dimpling, No nipple discharge or bleeding, No axillary or supraclavicular adenopathy Heart: regular rate and rhythm Abdomen: soft, non-tender; no masses,  no organomegaly Extremities: extremities normal, atraumatic, no cyanosis or edema Skin:  Skin color, texture, turgor normal. No rashes or lesions Lymph nodes: Cervical, supraclavicular, and axillary nodes normal. No abnormal inguinal nodes palpated Neurologic: Grossly normal   Pelvic: External genitalia:  no lesions              Urethra:  normal appearing urethra with no masses, tenderness or lesions              Bartholin's and Skene's: normal                 Vagina: normal appearing vagina with normal color and discharge, no lesions              Cervix: no cervical motion tenderness, no lesions and normal appearance              Pap taken: no cervical motion tenderness, no lesions and IUD noted in  cervical os Bimanual Exam:  Uterus:  normal size, contour, position, consistency, mobility, non-tender              Adnexa: normal adnexa and no mass, fullness, tenderness               Rectovaginal: Confirms               Anus:  normal sphincter tone, no lesions  Chaperone present: yes  A:  Well Woman with normal exam  Contraception Mirena IUD due for removal in 2022  Psoriasis under dermatology management  Obesity on weight loss  Screening labs  P:   Reviewed health and wellness pertinent to exam  Warning signs reviewed with IUD and need to advise.  Continue follow up as indicated  Labs: CMP,Lipid panel , TSH, Vitamin D, Hgb A1-C  Pap smear as above not taken   counseled on breast self exam, mammography screening, STD prevention, HIV risk factors and prevention, adequate intake of calcium and vitamin D, diet and exercise  return annually or prn  An After Visit Summary was printed and given to the patient.

## 2016-12-03 NOTE — Patient Instructions (Signed)

## 2016-12-04 LAB — VITAMIN D 25 HYDROXY (VIT D DEFICIENCY, FRACTURES): Vit D, 25-Hydroxy: 35 ng/mL (ref 30–100)

## 2016-12-04 LAB — HEMOGLOBIN, FINGERSTICK: Hemoglobin, fingerstick: 13.4 g/dL (ref 12.0–15.0)

## 2016-12-06 NOTE — Progress Notes (Signed)
Encounter reviewed Dimitris Shanahan, MD   

## 2016-12-16 DIAGNOSIS — G43009 Migraine without aura, not intractable, without status migrainosus: Secondary | ICD-10-CM | POA: Insufficient documentation

## 2016-12-16 DIAGNOSIS — L409 Psoriasis, unspecified: Secondary | ICD-10-CM | POA: Insufficient documentation

## 2017-02-18 ENCOUNTER — Other Ambulatory Visit: Payer: Self-pay | Admitting: Orthopedic Surgery

## 2017-02-18 DIAGNOSIS — M545 Low back pain: Principal | ICD-10-CM

## 2017-02-18 DIAGNOSIS — G8929 Other chronic pain: Secondary | ICD-10-CM

## 2017-02-27 ENCOUNTER — Telehealth: Payer: Self-pay | Admitting: Certified Nurse Midwife

## 2017-02-27 DIAGNOSIS — L409 Psoriasis, unspecified: Secondary | ICD-10-CM

## 2017-02-27 NOTE — Telephone Encounter (Signed)
Patient is asking for another recommendation to a Dermatologist. Patient was not pleased with the first recommendation.

## 2017-02-27 NOTE — Telephone Encounter (Signed)
Spoke with patient, advised referral placed for Dr. Amy Swaziland at [facility]. Advised our referral department will follow-up with scheduling. Patient verbalizes understanding and is agreeable.  Routing to provider for final review. Patient is agreeable to disposition. Will close encounter.  Cc: Braxton Feathers

## 2017-02-27 NOTE — Telephone Encounter (Signed)
Samantha Bird is who I would recommend

## 2017-02-27 NOTE — Telephone Encounter (Signed)
Spoke with patient. Patient states she has seen Dr. Danella Deis for Dermatology in past for psoriasis. Patient states she was referred to another provider in that practice once she retired, patient not satisfied with physician. Advised patient can place referral for [facility]. Patient states she discussed dermatologist at last AEX with Leota Sauers, CNM and would like her opinion on dermatologist referral, physician specific. Advised patient would review with Leota Sauers, CNM and return call with recommendations, patient is agreeable.   Leota Sauers, CNM -please advise on dermatology referral?

## 2017-02-27 NOTE — Telephone Encounter (Signed)
Message left to return call to Vaishali Baise at [phone].    

## 2017-03-03 ENCOUNTER — Ambulatory Visit
Admission: RE | Admit: 2017-03-03 | Discharge: 2017-03-03 | Disposition: A | Discharge status: Home / Self Care | Payer: 59 | Source: Ambulatory Visit | Attending: Orthopedic Surgery | Admitting: Orthopedic Surgery

## 2017-03-03 ENCOUNTER — Other Ambulatory Visit: Payer: Self-pay | Admitting: Orthopedic Surgery

## 2017-03-03 DIAGNOSIS — G8929 Other chronic pain: Secondary | ICD-10-CM

## 2017-03-03 DIAGNOSIS — M545 Low back pain: Principal | ICD-10-CM

## 2017-03-03 MED ORDER — IOPAMIDOL (ISOVUE-M 200) INJECTION 41%
1.0000 mL | Freq: Once | INTRAMUSCULAR | Status: AC
  Administered 2017-03-03: 1 mL via INTRA_ARTICULAR

## 2017-03-03 MED ORDER — METHYLPREDNISOLONE ACETATE 40 MG/ML INJ SUSP (RADIOLOG
120.0000 mg | Freq: Once | INTRAMUSCULAR | Status: AC
  Administered 2017-03-03: 120 mg via INTRA_ARTICULAR

## 2017-03-06 ENCOUNTER — Telehealth: Payer: Self-pay | Admitting: Certified Nurse Midwife

## 2017-03-06 NOTE — Telephone Encounter (Signed)
Left voicemail regarding referral appointment. The information is listed below. Should the patient need to cancel or reschedule this appointment, please advise them to call the office they've been referred to in order to reschedule.  Dr. Aris Lot [facility] [address] [place]  Phone: [phone]  Dr Loreta Ave 03-31-17 @ 9:20am. Please arrive 15 minutes early and bring your insurance card, photo id and list of medications.

## 2017-03-12 ENCOUNTER — Other Ambulatory Visit: Payer: Self-pay | Admitting: Orthopedic Surgery

## 2017-03-12 DIAGNOSIS — G8929 Other chronic pain: Secondary | ICD-10-CM

## 2017-03-12 DIAGNOSIS — M545 Low back pain: Principal | ICD-10-CM

## 2017-03-23 ENCOUNTER — Ambulatory Visit
Admission: RE | Admit: 2017-03-23 | Discharge: 2017-03-23 | Disposition: A | Discharge status: Home / Self Care | Payer: 59 | Source: Ambulatory Visit | Attending: Orthopedic Surgery | Admitting: Orthopedic Surgery

## 2017-03-23 DIAGNOSIS — M545 Low back pain: Principal | ICD-10-CM

## 2017-03-23 DIAGNOSIS — G8929 Other chronic pain: Secondary | ICD-10-CM

## 2017-03-23 MED ORDER — IOPAMIDOL (ISOVUE-M 200) INJECTION 41%
1.0000 mL | Freq: Once | INTRAMUSCULAR | Status: AC
  Administered 2017-03-23: 1 mL via EPIDURAL

## 2017-03-23 MED ORDER — METHYLPREDNISOLONE ACETATE 40 MG/ML INJ SUSP (RADIOLOG
120.0000 mg | Freq: Once | INTRAMUSCULAR | Status: AC
  Administered 2017-03-23: 120 mg via EPIDURAL

## 2017-06-15 ENCOUNTER — Telehealth: Payer: Self-pay | Admitting: Certified Nurse Midwife

## 2017-06-15 NOTE — Telephone Encounter (Signed)
Patient called requesting an appointment for right breast pain.  Last seen: 12/03/16

## 2017-06-15 NOTE — Telephone Encounter (Signed)
Spoke with patient. Patient states that she has been having ongoing right breast pain for 2 weeks. Denies any swelling, redness, temperature change to breast, or nipple discharge. Offered appointment to see MD today but patient declines wants to see Leota Sauerseborah Leonard CNM only. Appointment scheduled for 06/16/2017 at 12:45 pm with Leota Sauerseborah Leonard CNM. Patient is agreeable to date and time.  Routing to provider for final review. Patient agreeable to disposition. Will close encounter.

## 2017-06-16 ENCOUNTER — Ambulatory Visit (INDEPENDENT_AMBULATORY_CARE_PROVIDER_SITE_OTHER): Payer: 59 | Admitting: Certified Nurse Midwife

## 2017-06-16 ENCOUNTER — Encounter: Payer: Self-pay | Admitting: Certified Nurse Midwife

## 2017-06-16 ENCOUNTER — Other Ambulatory Visit: Payer: Self-pay | Admitting: Obstetrics

## 2017-06-16 VITALS — BP 108/70 | HR 72 | Resp 16 | Ht 68.0 in | Wt 282.0 lb

## 2017-06-16 DIAGNOSIS — N6310 Unspecified lump in the right breast, unspecified quadrant: Secondary | ICD-10-CM | POA: Diagnosis not present

## 2017-06-16 DIAGNOSIS — N644 Mastodynia: Secondary | ICD-10-CM

## 2017-06-16 NOTE — Addendum Note (Signed)
Addended by: Leda MinHAMM, Antavious Spanos N on: 06/16/2017 01:43 PM   Modules accepted: Orders

## 2017-06-16 NOTE — Progress Notes (Signed)
Patient scheduled while in office. Spoke with Samantha Bird at [facility]. Patient scheduled for bilateral diagnostic MMG and right breast US on 06/18/17 at 8:30am. Patient is agreeable to date and time.

## 2017-06-16 NOTE — Patient Instructions (Signed)

## 2017-06-16 NOTE — Progress Notes (Signed)
   Subjective:   46 y.o. DivorcedCaucasian female presents for evaluation of left breast tenderness. Onset of the symptoms was 2 weeks ago. Patient also had some increase redness from perspiration and spontaneously resolved. No new bras or manipulation of breast. Has not noted any mass, but has saline implants. Has noted no change of this. Patient sought evaluation because  Contributing factors include none. Denies chills and fatigue. Patient denies hiistory of trauma, bites, or injuries. Last mammogram was 10 months ago and negative.  No abnormal mammograms in the past.  Review of Systems    Objective:    General appearance: alert, cooperative, appears stated age and no distress Breasts: normal appearance, no masses or tenderness, No nipple retraction or dimpling, No nipple discharge or bleeding, No axillary or supraclavicular adenopathy, positive findings: right breast tenderness noted at 6 o'clock 4 fb from aerola with 1-2 cm cystic mass, and cluster in adjacent area. ? Saline implant     Assessment:   ASSESSMENT:Patient is diagnosed with right breast tenderness with 1-2 cm cyst noted on edge of breast at 6 o'clock and 4 FB down from aerola edge with some tenderness . ? Implant saline leakage  Plan:   PLAN: Reviewed findings with patient and need for evaluation with diagnostic mammogram and US of area. Patient will be scheduled prior to leaving today. Suggested reduction of caffeine in diet, which also increase tenderness. Questions addressed.  Rv prn

## 2017-06-18 ENCOUNTER — Ambulatory Visit: Admission: RE | Admit: 2017-06-18 | Payer: 59 | Source: Ambulatory Visit

## 2017-06-18 ENCOUNTER — Ambulatory Visit
Admission: RE | Admit: 2017-06-18 | Discharge: 2017-06-18 | Disposition: A | Discharge status: Home / Self Care | Payer: 59 | Source: Ambulatory Visit | Attending: Certified Nurse Midwife | Admitting: Certified Nurse Midwife

## 2017-06-18 DIAGNOSIS — N6310 Unspecified lump in the right breast, unspecified quadrant: Secondary | ICD-10-CM

## 2017-06-18 DIAGNOSIS — N644 Mastodynia: Secondary | ICD-10-CM

## 2017-06-19 ENCOUNTER — Telehealth: Payer: Self-pay | Admitting: *Deleted

## 2017-06-19 NOTE — Telephone Encounter (Signed)
Notes recorded by Leda MinHamm, Batool Majid N, RN on 06/19/2017 at 11:44 AM EDT Left message to call Noreene LarssonJill at [phone].  ------  Notes recorded by Verner CholLeonard, Deborah S, CNM on 06/18/2017 at 12:45 PM EDT Reviewed mammogram and US finding and area seen noted fat lobule And fibroglandular tissue noted. No evidence of malignancy. Would like to recheck breast in 4 weeks due to tenderness previously noted, to see if resolved and any change in breast

## 2017-07-03 NOTE — Telephone Encounter (Signed)
Spoke with patient, advised as seen below per Leota Sauerseborah Leonard, CNM. Patient scheduled for OV on 07/22/17 at 10am with Leota Sauerseborah Leonard, CNM. Patient declined earlier appointments. Patient verbalizes understanding.  Routing to provider for final review. Patient is agreeable to disposition. Will close encounter.

## 2017-07-22 ENCOUNTER — Ambulatory Visit: Payer: Self-pay | Admitting: Certified Nurse Midwife

## 2017-08-04 ENCOUNTER — Encounter: Payer: Self-pay | Admitting: Certified Nurse Midwife

## 2017-08-04 ENCOUNTER — Ambulatory Visit (INDEPENDENT_AMBULATORY_CARE_PROVIDER_SITE_OTHER): Payer: 59 | Admitting: Certified Nurse Midwife

## 2017-08-04 VITALS — BP 116/70 | HR 72 | Resp 16 | Ht 68.0 in | Wt 280.0 lb

## 2017-08-04 DIAGNOSIS — N6310 Unspecified lump in the right breast, unspecified quadrant: Secondary | ICD-10-CM | POA: Diagnosis not present

## 2017-08-04 NOTE — Progress Notes (Signed)
   Subjective:   46 y.o. DivorcedCaucasian female presents for follow up  of right breast mass which was tender, noted on 06/16/17. Patient had diagnostic mammogram and us of right breast with benign findings of fat lobule and fibroglandular tissue. Patient has noted tenderness has resolved. Feels much better. Patient also has implants bilateral and was concerned that she had implant issue. All normal finding.   Review of Systems   Objective:   General appearance: alert, cooperative, appears stated age and no distress Breasts: normal appearance, no masses or tenderness, No nipple retraction or dimpling, No nipple discharge or bleeding, No axillary or supraclavicular adenopathy, right breast area of concern has decreased in size and non tender.  More fatty feel now.  Assessment:   ASSESSMENT:Patient is diagnosed with normal breast exam, fatty fibroglandular feel in area of concern.   Plan:   PLAN:Reviewed findings with patient and patient also palpated area. Agreed feels smaller. Patient will continue SBE and will come in if any change. Routine screening mammogram in one year unless change.

## 2017-08-04 NOTE — Patient Instructions (Signed)
Breast Self-Awareness Breast self-awareness means:  Knowing how your breasts look.  Knowing how your breasts feel.  Checking your breasts every month for changes.  Telling your doctor if you notice a change in your breasts.  Breast self-awareness allows you to notice a breast problem early while it is still small. How to do a breast self-exam One way to learn what is normal for your breasts and to check for changes is to do a breast self-exam. To do a breast self-exam: Look for Changes  1. Take off all the clothes above your waist. 2. Stand in front of a mirror in a room with good lighting. 3. Put your hands on your hips. 4. Push your hands down. 5. Look at your breasts and nipples in the mirror to see if one breast or nipple looks different than the other. Check to see if: ? The shape of one breast is different. ? The size of one breast is different. ? There are wrinkles, dips, and bumps in one breast and not the other. 6. Look at each breast for changes in your skin, such as: ? Redness. ? Scaly areas. 7. Look for changes in your nipples, such as: ? Liquid around the nipples. ? Bleeding. ? Dimpling. ? Redness. ? A change in where the nipples are. Feel for Changes 1. Lie on your back on the floor. 2. Feel each breast. To do this, follow these steps: ? Pick a breast to feel. ? Put the arm closest to that breast above your head. ? Use your other arm to feel the nipple area of your breast. Feel the area with the pads of your three middle fingers by making small circles with your fingers. For the first circle, press lightly. For the second circle, press harder. For the third circle, press even harder. ? Keep making circles with your fingers at the light, harder, and even harder pressures as you move down your breast. Stop when you feel your ribs. ? Move your fingers a little toward the center of your body. ? Start making circles with your fingers again, this time going up until  you reach your collarbone. ? Keep making up and down circles until you reach your armpit. Remember to keep using the three pressures. ? Feel the other breast in the same way. 3. Sit or stand in the shower or tub. 4. With soapy water on your skin, feel each breast the same way you did in step 2, when you were lying on the floor. Write Down What You Find  After doing the self-exam, write down:  What is normal for each breast.  Any changes you find in each breast.  When you last had your period.  How often should I check my breasts? Check your breasts every month. If you are breastfeeding, the best time to check them is after you feed your baby or after you use a breast pump. If you get periods, the best time to check your breasts is 5-7 days after your period is over. When should I see my doctor? See your doctor if you notice:  A change in shape or size of your breasts or nipples.  A change in the skin of your breast or nipples, such as red or scaly skin.  Unusual fluid coming from your nipples.  A lump or thick area that was not there before.  Pain in your breasts.  Anything that concerns you.  This information is not intended to replace advice given to   you by your health care provider. Make sure you discuss any questions you have with your health care provider. Document Released: 04/28/2008 Document Revised: 04/17/2016 Document Reviewed: 09/30/2015 Elsevier Interactive Patient Education  2018 Elsevier Inc.  

## 2017-10-21 DIAGNOSIS — I493 Ventricular premature depolarization: Secondary | ICD-10-CM | POA: Insufficient documentation

## 2017-10-21 DIAGNOSIS — R002 Palpitations: Secondary | ICD-10-CM | POA: Insufficient documentation

## 2017-11-24 DIAGNOSIS — I447 Left bundle-branch block, unspecified: Secondary | ICD-10-CM

## 2017-11-24 HISTORY — DX: Left bundle-branch block, unspecified: I44.7

## 2017-12-11 ENCOUNTER — Ambulatory Visit: Payer: 59 | Admitting: Certified Nurse Midwife

## 2017-12-15 ENCOUNTER — Ambulatory Visit: Payer: 59 | Admitting: Certified Nurse Midwife

## 2018-01-01 ENCOUNTER — Ambulatory Visit: Payer: 59 | Admitting: Certified Nurse Midwife

## 2018-01-01 ENCOUNTER — Other Ambulatory Visit: Payer: Self-pay

## 2018-01-01 ENCOUNTER — Encounter: Payer: Self-pay | Admitting: Certified Nurse Midwife

## 2018-01-01 VITALS — Ht 68.25 in | Wt 278.0 lb

## 2018-01-01 DIAGNOSIS — R6889 Other general symptoms and signs: Secondary | ICD-10-CM | POA: Diagnosis not present

## 2018-01-01 DIAGNOSIS — Z01419 Encounter for gynecological examination (general) (routine) without abnormal findings: Secondary | ICD-10-CM | POA: Diagnosis not present

## 2018-01-01 DIAGNOSIS — E559 Vitamin D deficiency, unspecified: Secondary | ICD-10-CM

## 2018-01-01 NOTE — Progress Notes (Signed)
47 y.o. G0P0000 Divorced  Caucasian Fe here for annual exam. Mirena IUD working well, periods none, some period like feelings at times. No STD screening needed. [place]ver[place]. Sees Orthopedic([facility]) for back issues and medication management. Dr. Neale BurlyFreeman for migraine headache management, very well controlled..  Has plans to start exercise plan with partner soon and work on diet also. Aware of need to work on. Patient is facing back surgery due to pain and degenerative disc, but wants weight down for better recovery. No other health issues today. Planning trip to [place]!  No LMP recorded. Patient is not currently having periods (Reason: IUD).         Inserted 12-14-15 Sexually active: Yes.    The current method of family planning is IUD.    Exercising: No.  exercise Smoker:  no  Health Maintenance: Pap:  11-28-15 neg History of Abnormal Pap: yes MMG:  7/18 bilateral & rt  Breast u/s 6 oclock position category b density neg Self Breast exams: yes Colonoscopy:  none BMD:   none TDaP:  2012 Shingles: no Pneumonia: no Hep C and HIV: both neg 2017 Labs: if needed   reports that she has quit smoking. she has never used smokeless tobacco. She reports that she drinks about 1.2 oz of alcohol per week. She reports that she does not use drugs.  Past Medical History:  Diagnosis Date  . 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 Condyloma   . IBS (irritable bowel syndrome)   . Migraines    chronic,no aura    Past Surgical History:  Procedure Laterality Date  . AUGMENTATION MAMMAPLASTY Bilateral 2006  . BREAST ENHANCEMENT SURGERY    . COLPOSCOPY    . INTRAUTERINE DEVICE (IUD) INSERTION     inserted in 06,removed, then new one inserted 4/11, 12-14-15 mirena removed & another inserted  . TONSILLECTOMY AND ADENOIDECTOMY      Current Outpatient Medications  Medication Sig Dispense Refill  . calcipotriene (DOVONOX) 0.005 % ointment     .  clobetasol ointment (TEMOVATE) 0.05 %     . cyclobenzaprine (FLEXERIL) 10 MG tablet     . gabapentin (NEURONTIN) 300 MG capsule     . levonorgestrel (MIRENA) 20 MCG/24HR IUD 1 each by Intrauterine route once.    . Multiple Vitamins-Minerals (MULTIVITAMIN PO) Take by mouth as needed.    . topiramate (TOPAMAX) 100 MG tablet daily.     No current facility-administered medications for this visit.     Family History  Problem Relation Age of Onset  . Hypertension Brother   . Diabetes Mother   . Heart attack Father     ROS:  Pertinent items are noted in HPI.  Otherwise, a comprehensive ROS was negative.  Exam:   Ht 5' 8.25" (1.734 m)   Wt 278 lb (126.1 kg)   BMI 41.96 kg/m  Height: 5' 8.25" (173.4 cm) Ht Readings from Last 3 Encounters:  01/01/18 5' 8.25" (1.734 m)  08/04/17 5\' 8"  (1.727 m)  06/16/17 5\' 8"  (1.727 m)    General appearance: alert, cooperative and appears stated age Head: Normocephalic, without obvious abnormality, atraumatic Neck: no adenopathy, supple, symmetrical, trachea midline and thyroid normal to inspection and palpation Lungs: clear to auscultation bilaterally Breasts: normal appearance, no masses or tenderness, No nipple retraction or dimpling, No nipple discharge or bleeding, No axillary or supraclavicular adenopathy Heart: regular rate and rhythm Abdomen: soft, non-tender; no masses,  no organomegaly Extremities: extremities normal, atraumatic, no cyanosis or edema Skin: Skin color, texture, turgor normal. No rashes or lesions Lymph nodes: Cervical, supraclavicular, and axillary nodes normal. No abnormal inguinal nodes palpated Neurologic: Grossly normal   Pelvic: External genitalia:  no lesions              Urethra:  normal appearing urethra with no masses, tenderness or lesions              Bartholin's and Skene's: normal                 Vagina: normal appearing vagina with normal color and discharge, no lesions              Cervix: no cervical  motion tenderness, no lesions and normal appearance, IUD string noted in cervical os              Pap taken: No. Bimanual Exam:  Uterus:  normal size, contour, position, consistency, mobility, non-tender              Adnexa: normal adnexa and no mass, fullness, tenderness               Rectovaginal: Confirms               Anus:  normal sphincter tone, no lesions  Chaperone present: yes  A:  Well Woman with normal exam  Contraception Mirena IUD due for removal 11/2020  Obesity with weight loss/exercise program in place to start  Migraine headache history and degenerative disease with MD management  History of Vitamin D deficiency  Screening labs  P:   Reviewed health and wellness pertinent to exam  Warning signs of IUD given and need to advise if occurs  Encouraged to stay with her journey for weight control, for improved health  Continue follow up with MD as indicated  Labs: Vitamin  D,CMP, Lipid panel, HGB A1-c  Pap smear: no   counseled on breast self exam, STD prevention, HIV risk factors and prevention, adequate intake of calcium and vitamin D, diet and exercise  return annually or prn  An After Visit Summary was printed and given to the patient.

## 2018-01-01 NOTE — Patient Instructions (Signed)

## 2018-01-02 LAB — COMPREHENSIVE METABOLIC PANEL
ALBUMIN: 4.7 g/dL (ref 3.5–5.5)
ALT: 54 IU/L — ABNORMAL HIGH (ref 0–32)
AST: 44 IU/L — ABNORMAL HIGH (ref 0–40)
Albumin/Globulin Ratio: 1.5 (ref 1.2–2.2)
Alkaline Phosphatase: 91 IU/L (ref 39–117)
BUN / CREAT RATIO: 15 (ref 9–23)
BUN: 14 mg/dL (ref 6–24)
Bilirubin Total: 0.4 mg/dL (ref 0.0–1.2)
CALCIUM: 10 mg/dL (ref 8.7–10.2)
CO2: 16 mmol/L — AB (ref 20–29)
CREATININE: 0.91 mg/dL (ref 0.57–1.00)
Chloride: 106 mmol/L (ref 96–106)
GFR calc Af Amer: 86 mL/min/{1.73_m2} (ref >59)
GFR, EST NON AFRICAN AMERICAN: 74 mL/min/{1.73_m2} (ref >59)
GLOBULIN, TOTAL: 3.1 g/dL (ref 1.5–4.5)
Glucose: 103 mg/dL — ABNORMAL HIGH (ref 65–99)
Potassium: 4.4 mmol/L (ref 3.5–5.2)
Sodium: 141 mmol/L (ref 134–144)
TOTAL PROTEIN: 7.8 g/dL (ref 6.0–8.5)

## 2018-01-02 LAB — VITAMIN D 25 HYDROXY (VIT D DEFICIENCY, FRACTURES): Vit D, 25-Hydroxy: 56.4 ng/mL (ref 30.0–100.0)

## 2018-01-02 LAB — HEMOGLOBIN A1C
ESTIMATED AVERAGE GLUCOSE: 114 mg/dL
Hgb A1c MFr Bld: 5.6 % (ref 4.8–5.6)

## 2018-01-02 LAB — LIPID PANEL
CHOL/HDL RATIO: 4.2 ratio (ref 0.0–4.4)
Cholesterol, Total: 194 mg/dL (ref 100–199)
HDL: 46 mg/dL (ref >39)
LDL CALC: 108 mg/dL — AB (ref 0–99)
Triglycerides: 199 mg/dL — ABNORMAL HIGH (ref 0–149)
VLDL Cholesterol Cal: 40 mg/dL (ref 5–40)

## 2018-01-06 ENCOUNTER — Other Ambulatory Visit: Payer: Self-pay | Admitting: Certified Nurse Midwife

## 2018-01-06 ENCOUNTER — Telehealth: Payer: Self-pay | Admitting: *Deleted

## 2018-01-06 DIAGNOSIS — R899 Unspecified abnormal finding in specimens from other organs, systems and tissues: Secondary | ICD-10-CM

## 2018-01-06 NOTE — Telephone Encounter (Signed)
Notes recorded by Leda MinHamm, Makaylen Thieme N, RN on 01/06/2018 at 11:46 AM EST Left message to call Noreene LarssonJill at [phone].   Notes recorded by Verner CholLeonard, Deborah S, CNM on 01/06/2018 at 7:29 AM EST Notify patient her Vitamin D is normal at 56.4 CMP glucose elevated at 103, but HGB A1-C is normal,kidney profile normal, liver profile all normal except for AST which is 44 and ALT which is 54, need to repeat, avoid all OTC medications such as Advil, Tylenol and recheck in 2 weeks fasting order placed Lipid panel cholesterol is normal, Triglycerides elevated at 199 and LDL is slightly elevated at 108.HDL is normal at 46. Continue to work on weight control and exercise. Work on colorful vegetables and fruits and lean protein choices. Repeat next aex fasting HGB A1-c is normal at 5.6 normal is 5.6 or less

## 2018-01-06 NOTE — Telephone Encounter (Signed)
Spoke with patient, advised as seen below per Leota Sauerseborah Leonard, CNM. Patient scheduled for lab appt on 01/22/18 at 9:45am. Patient verbalizes understanding and is agreeable. Will close encounter.

## 2018-01-22 ENCOUNTER — Other Ambulatory Visit: Payer: Self-pay

## 2018-02-01 ENCOUNTER — Other Ambulatory Visit: Payer: Self-pay | Admitting: Certified Nurse Midwife

## 2018-02-01 NOTE — Telephone Encounter (Signed)
Medication refill request: Nystatin cream 30gm Last AEX:  01-01-18 Next AEX: 01-07-19 Last MMG (if hormonal medication request): 06-18-17 [id number] Refill authorized: Please advise

## 2018-02-02 NOTE — Telephone Encounter (Signed)
Needs a call to see if still using just Nystatin, she was using Triamcinolone also and mixing

## 2018-02-02 NOTE — Telephone Encounter (Signed)
Spoke with patient and she states she hasn't used Nystatin or Triamcinolone in over a year but feels she is having recurrent symptoms. She wasn't sure how she had used cream before but ran out of Nystatin and requested refill. Advised will ask Sara Chuebbie Leonard, CNM if she should be using both creams and mixing or if okay to use Nystatin alone. Patient states if needs both will need a refill on Triamcinolone also. Routed to provider.

## 2018-02-02 NOTE — Telephone Encounter (Signed)
Called patient and let her know Sara Chuebbie Leonard, CNM only wants her to use the Nystatin and the RX was sent to pharmacy.

## 2018-02-05 ENCOUNTER — Other Ambulatory Visit: Payer: Self-pay

## 2018-02-05 ENCOUNTER — Other Ambulatory Visit: Payer: 59

## 2018-02-05 DIAGNOSIS — R945 Abnormal results of liver function studies: Secondary | ICD-10-CM

## 2018-02-05 DIAGNOSIS — R7989 Other specified abnormal findings of blood chemistry: Secondary | ICD-10-CM

## 2018-02-05 DIAGNOSIS — R7401 Elevation of levels of liver transaminase levels: Secondary | ICD-10-CM

## 2018-02-05 DIAGNOSIS — R740 Nonspecific elevation of levels of transaminase and lactic acid dehydrogenase [LDH]: Principal | ICD-10-CM

## 2018-02-05 DIAGNOSIS — R899 Unspecified abnormal finding in specimens from other organs, systems and tissues: Secondary | ICD-10-CM

## 2018-02-15 DIAGNOSIS — G43719 Chronic migraine without aura, intractable, without status migrainosus: Secondary | ICD-10-CM | POA: Diagnosis not present

## 2018-02-15 DIAGNOSIS — G43019 Migraine without aura, intractable, without status migrainosus: Secondary | ICD-10-CM | POA: Diagnosis not present

## 2018-02-17 DIAGNOSIS — L249 Irritant contact dermatitis, unspecified cause: Secondary | ICD-10-CM | POA: Diagnosis not present

## 2018-02-17 DIAGNOSIS — L408 Other psoriasis: Secondary | ICD-10-CM | POA: Diagnosis not present

## 2018-05-05 ENCOUNTER — Telehealth: Payer: Self-pay | Admitting: *Deleted

## 2018-05-05 NOTE — Telephone Encounter (Signed)
Patient notified that she needs to come back for redraw due to blood not being sent out. Pt was in a meeting & understands & she will callback to make lab appt. Pt aware of importance of follow up.

## 2018-05-05 NOTE — Telephone Encounter (Signed)
Follow-up call to patient regarding labs. Previously drawn follow-up ALT/AST on 02-05-18 were not processed by lab.  Left message to call back. Need to have patietn come for redraw. Left message to speak to Kennon RoundsSally or [name].

## 2018-05-05 NOTE — Telephone Encounter (Signed)
Patient returning call to Samantha Bird or [name].

## 2018-05-12 NOTE — Telephone Encounter (Signed)
No appt has been made. Routing to [employer] Yeakley nursing supervisor

## 2018-05-13 NOTE — Telephone Encounter (Signed)
Call to patient. Per ROI, can leave message on voice mail. Left message this is follow-up call regarding lab tests that need re-draw. Can be scheduled at location close to home or work. Request call back to [facility], Materials engineerclinical supervisor for [facility].

## 2018-05-13 NOTE — Telephone Encounter (Signed)
Call from patient. Lab appointment scheduled for 05-21-18.   Routing to provider. Encounter closed.   CC: Cornelia CopaJoy Johnson, CMA.

## 2018-05-21 ENCOUNTER — Other Ambulatory Visit (INDEPENDENT_AMBULATORY_CARE_PROVIDER_SITE_OTHER): Payer: 59

## 2018-05-21 DIAGNOSIS — R945 Abnormal results of liver function studies: Secondary | ICD-10-CM | POA: Diagnosis not present

## 2018-05-21 DIAGNOSIS — R740 Nonspecific elevation of levels of transaminase and lactic acid dehydrogenase [LDH]: Secondary | ICD-10-CM

## 2018-05-21 DIAGNOSIS — R7989 Other specified abnormal findings of blood chemistry: Secondary | ICD-10-CM

## 2018-05-21 DIAGNOSIS — R7401 Elevation of levels of liver transaminase levels: Secondary | ICD-10-CM

## 2018-05-22 LAB — ALT: ALT: 34 IU/L — AB (ref 0–32)

## 2018-05-22 LAB — AST: AST: 25 IU/L (ref 0–40)

## 2018-06-23 DIAGNOSIS — L249 Irritant contact dermatitis, unspecified cause: Secondary | ICD-10-CM | POA: Diagnosis not present

## 2018-06-23 DIAGNOSIS — L408 Other psoriasis: Secondary | ICD-10-CM | POA: Diagnosis not present

## 2018-06-23 DIAGNOSIS — Z79899 Other long term (current) drug therapy: Secondary | ICD-10-CM | POA: Diagnosis not present

## 2018-06-28 ENCOUNTER — Other Ambulatory Visit: Payer: Self-pay | Admitting: Certified Nurse Midwife

## 2018-06-28 DIAGNOSIS — Z1231 Encounter for screening mammogram for malignant neoplasm of breast: Secondary | ICD-10-CM

## 2018-07-14 DIAGNOSIS — M545 Low back pain: Secondary | ICD-10-CM | POA: Diagnosis not present

## 2018-07-14 DIAGNOSIS — G8929 Other chronic pain: Secondary | ICD-10-CM | POA: Diagnosis not present

## 2018-07-22 ENCOUNTER — Ambulatory Visit: Payer: 59

## 2018-08-13 ENCOUNTER — Ambulatory Visit
Admission: RE | Admit: 2018-08-13 | Discharge: 2018-08-13 | Disposition: A | Discharge status: Home / Self Care | Payer: 59 | Source: Ambulatory Visit | Attending: Certified Nurse Midwife | Admitting: Certified Nurse Midwife

## 2018-08-13 DIAGNOSIS — Z1231 Encounter for screening mammogram for malignant neoplasm of breast: Secondary | ICD-10-CM

## 2018-09-22 DIAGNOSIS — Z23 Encounter for immunization: Secondary | ICD-10-CM | POA: Diagnosis not present

## 2018-09-22 DIAGNOSIS — M48061 Spinal stenosis, lumbar region without neurogenic claudication: Secondary | ICD-10-CM | POA: Diagnosis not present

## 2018-09-29 DIAGNOSIS — Z3202 Encounter for pregnancy test, result negative: Secondary | ICD-10-CM | POA: Diagnosis not present

## 2018-09-29 DIAGNOSIS — R072 Precordial pain: Secondary | ICD-10-CM | POA: Diagnosis not present

## 2018-09-29 DIAGNOSIS — R0602 Shortness of breath: Secondary | ICD-10-CM | POA: Diagnosis not present

## 2018-11-05 DIAGNOSIS — L820 Inflamed seborrheic keratosis: Secondary | ICD-10-CM | POA: Diagnosis not present

## 2018-12-23 DIAGNOSIS — H109 Unspecified conjunctivitis: Secondary | ICD-10-CM | POA: Diagnosis not present

## 2018-12-23 DIAGNOSIS — Z131 Encounter for screening for diabetes mellitus: Secondary | ICD-10-CM | POA: Diagnosis not present

## 2018-12-23 DIAGNOSIS — E669 Obesity, unspecified: Secondary | ICD-10-CM | POA: Diagnosis not present

## 2018-12-27 DIAGNOSIS — R110 Nausea: Secondary | ICD-10-CM | POA: Diagnosis not present

## 2018-12-27 DIAGNOSIS — J0100 Acute maxillary sinusitis, unspecified: Secondary | ICD-10-CM | POA: Diagnosis not present

## 2018-12-27 DIAGNOSIS — E669 Obesity, unspecified: Secondary | ICD-10-CM | POA: Diagnosis not present

## 2019-01-07 ENCOUNTER — Other Ambulatory Visit (HOSPITAL_COMMUNITY)
Admission: RE | Admit: 2019-01-07 | Discharge: 2019-01-07 | Disposition: A | Discharge status: Home / Self Care | Payer: 59 | Source: Ambulatory Visit | Attending: Certified Nurse Midwife | Admitting: Certified Nurse Midwife

## 2019-01-07 ENCOUNTER — Ambulatory Visit (INDEPENDENT_AMBULATORY_CARE_PROVIDER_SITE_OTHER): Payer: 59 | Admitting: Certified Nurse Midwife

## 2019-01-07 ENCOUNTER — Encounter: Payer: Self-pay | Admitting: Certified Nurse Midwife

## 2019-01-07 ENCOUNTER — Other Ambulatory Visit: Payer: Self-pay

## 2019-01-07 VITALS — BP 116/78 | HR 68 | Resp 16 | Ht 68.0 in | Wt 251.0 lb

## 2019-01-07 DIAGNOSIS — Z01419 Encounter for gynecological examination (general) (routine) without abnormal findings: Secondary | ICD-10-CM

## 2019-01-07 DIAGNOSIS — Z124 Encounter for screening for malignant neoplasm of cervix: Secondary | ICD-10-CM | POA: Diagnosis not present

## 2019-01-07 DIAGNOSIS — Z9189 Other specified personal risk factors, not elsewhere classified: Secondary | ICD-10-CM

## 2019-01-07 DIAGNOSIS — Z0000 Encounter for general adult medical examination without abnormal findings: Secondary | ICD-10-CM | POA: Diagnosis not present

## 2019-01-07 DIAGNOSIS — E559 Vitamin D deficiency, unspecified: Secondary | ICD-10-CM | POA: Diagnosis not present

## 2019-01-07 DIAGNOSIS — Z1211 Encounter for screening for malignant neoplasm of colon: Secondary | ICD-10-CM

## 2019-01-07 NOTE — Progress Notes (Signed)
48 y.o. G0P0000 Divorced  Caucasian Fe here for annual exam. Periods none with IUD. Occasional PMS that makes her aware of menses time. Has worked on weight loss, down to 26 pounds! Seeing Dr. Modesto Charon only if needed. Off all meds especially Gabapentin and uses OTC gel if needed for back pain. Off Topomax for headaches and no headaches now.. Exercising with Cardio. Planning to establish with new PCP in [facility] soon. No other health issues today.   No LMP recorded. (Menstrual status: IUD).          Sexually active: Yes.    The current method of family planning is IUD.    Exercising: Yes.    cardio Smoker:  no  Review of Systems  Constitutional: Negative.   HENT: Negative.   Eyes: Negative.   Respiratory: Negative.   Cardiovascular: Negative.   Gastrointestinal: Negative.   Genitourinary: Negative.   Musculoskeletal: Negative.   Skin: Negative.   Neurological: Negative.   Endo/Heme/Allergies: Negative.   Psychiatric/Behavioral: Negative.     Health Maintenance: Pap:  11-28-15 neg History of Abnormal Pap: yes MMG:  08-13-18 category b density birads 1:neg Self Breast exams: yes Colonoscopy:  none BMD:   none TDaP:  2012 Shingles: no Pneumonia: no Hep C and HIV: both neg 2017 Labs: if needed   reports that she has quit smoking. She has never used smokeless tobacco. She reports current alcohol use of about 2.0 standard drinks of alcohol per week. She reports that she does not use drugs.  Past Medical History:  Diagnosis Date  . 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 Condyloma   . IBS (irritable bowel syndrome)   . Migraines    chronic,no aura    Past Surgical History:  Procedure Laterality Date  . AUGMENTATION MAMMAPLASTY Bilateral 2006  . BREAST ENHANCEMENT SURGERY    . COLPOSCOPY    . INTRAUTERINE DEVICE (IUD) INSERTION     inserted in 06,removed, then new one inserted 4/11, 12-14-15 mirena removed & another inserted  . TONSILLECTOMY AND  ADENOIDECTOMY      Current Outpatient Medications  Medication Sig Dispense Refill  . calcipotriene (DOVONOX) 0.005 % ointment     . clobetasol ointment (TEMOVATE) 0.05 %     . cyclobenzaprine (FLEXERIL) 10 MG tablet     . gabapentin (NEURONTIN) 300 MG capsule     . levonorgestrel (MIRENA) 20 MCG/24HR IUD 1 each by Intrauterine route once.    . Multiple Vitamins-Minerals (MULTIVITAMIN PO) Take by mouth as needed.    . nystatin cream (MYCOSTATIN) Apply thinly to area bid x 5 days for itching 30 g 1  . topiramate (TOPAMAX) 100 MG tablet daily.     No current facility-administered medications for this visit.     Family History  Problem Relation Age of Onset  . Hypertension Brother   . Diabetes Mother   . Heart attack Father     ROS:  Pertinent items are noted in HPI.  Otherwise, a comprehensive ROS was negative.  Exam:   There were no vitals taken for this visit.   Ht Readings from Last 3 Encounters:  01/01/18 5' 8.25" (1.734 m)  08/04/17 5\' 8"  (1.727 m)  06/16/17 5\' 8"  (1.727 m)    General appearance: alert, cooperative and appears stated age Head: Normocephalic, without obvious abnormality, atraumatic Neck: no adenopathy, supple, symmetrical, trachea midline and thyroid normal to inspection and palpation Lungs: clear to auscultation bilaterally Breasts: normal appearance,  no masses or tenderness, No nipple retraction or dimpling, No nipple discharge or bleeding, No axillary or supraclavicular adenopathy Heart: regular rate and rhythm Abdomen: soft, non-tender; no masses,  no organomegaly Extremities: extremities normal, atraumatic, no cyanosis or edema Skin: Skin color, texture, turgor normal. No rashes or lesions Lymph nodes: Cervical, supraclavicular, and axillary nodes normal. No abnormal inguinal nodes palpated Neurologic: Grossly normal   Pelvic: External genitalia:  no lesions,  Normal female appearance              Urethra:  normal appearing urethra with no  masses, tenderness or lesions              Bartholin's and Skene's: normal                 Vagina: normal appearing vagina with normal color and discharge, no lesions              Cervix: no cervical motion tenderness, no lesions and normal appearance IUD string noted in cervix              Pap taken: Yes.   Bimanual Exam:  Uterus:  normal size, contour, position, consistency, mobility, non-tender and anteflexed              Adnexa: normal adnexa and no mass, fullness, tenderness               Rectovaginal: Confirms               Anus:  normal sphincter tone, no lesions  Chaperone present: yes  A:  Well Woman with normal exam  Contraception  Mirena IUD due for removal 12/13/2020  Intentional weight loss and now off most of her medications. Back pain and headaches have resolved.  Colonoscopy due  Screening labs  P:   Reviewed health and wellness pertinent to exam  Warning signs and expectations with IUD reviewed.             Encouraged to continue with weight loss journey.  Discussed risks/benefits of colonoscopy. Patient would like to schedule. Patient will be called with information regarding referral.  Labs: TSH, Vitamin D, Lipid panel, CMP  Pap smear: yes   counseled on breast self exam, mammography screening, feminine hygiene, adequate intake of calcium and vitamin D, diet and exercise  return annually or prn  An After Visit Summary was printed and given to the patient.

## 2019-01-08 LAB — COMPREHENSIVE METABOLIC PANEL
ALT: 31 IU/L (ref 0–32)
AST: 21 IU/L (ref 0–40)
Albumin/Globulin Ratio: 1.7 (ref 1.2–2.2)
Albumin: 4.8 g/dL (ref 3.8–4.8)
Alkaline Phosphatase: 95 IU/L (ref 39–117)
BUN / CREAT RATIO: 18 (ref 9–23)
BUN: 16 mg/dL (ref 6–24)
Bilirubin Total: 0.4 mg/dL (ref 0.0–1.2)
CO2: 24 mmol/L (ref 20–29)
Calcium: 9.5 mg/dL (ref 8.7–10.2)
Chloride: 97 mmol/L (ref 96–106)
Creatinine, Ser: 0.89 mg/dL (ref 0.57–1.00)
GFR calc Af Amer: 87 mL/min/{1.73_m2} (ref >59)
GFR calc non Af Amer: 76 mL/min/{1.73_m2} (ref >59)
Globulin, Total: 2.9 g/dL (ref 1.5–4.5)
Glucose: 84 mg/dL (ref 65–99)
Potassium: 4.5 mmol/L (ref 3.5–5.2)
Sodium: 139 mmol/L (ref 134–144)
Total Protein: 7.7 g/dL (ref 6.0–8.5)

## 2019-01-08 LAB — VITAMIN D 25 HYDROXY (VIT D DEFICIENCY, FRACTURES): Vit D, 25-Hydroxy: 54.5 ng/mL (ref 30.0–100.0)

## 2019-01-08 LAB — LIPID PANEL
Chol/HDL Ratio: 4 ratio (ref 0.0–4.4)
Cholesterol, Total: 247 mg/dL — ABNORMAL HIGH (ref 100–199)
HDL: 61 mg/dL (ref >39)
LDL Calculated: 154 mg/dL — ABNORMAL HIGH (ref 0–99)
Triglycerides: 162 mg/dL — ABNORMAL HIGH (ref 0–149)
VLDL Cholesterol Cal: 32 mg/dL (ref 5–40)

## 2019-01-08 LAB — TSH: TSH: 1.63 u[IU]/mL (ref 0.450–4.500)

## 2019-01-11 ENCOUNTER — Other Ambulatory Visit: Payer: Self-pay | Admitting: *Deleted

## 2019-01-11 DIAGNOSIS — E7889 Other lipoprotein metabolism disorders: Secondary | ICD-10-CM

## 2019-01-11 LAB — CYTOLOGY - PAP: HPV: DETECTED — AB

## 2019-01-12 ENCOUNTER — Other Ambulatory Visit: Payer: Self-pay | Admitting: Certified Nurse Midwife

## 2019-01-12 DIAGNOSIS — R87612 Low grade squamous intraepithelial lesion on cytologic smear of cervix (LGSIL): Secondary | ICD-10-CM

## 2019-01-13 ENCOUNTER — Telehealth: Payer: Self-pay | Admitting: Emergency Medicine

## 2019-01-13 NOTE — Telephone Encounter (Signed)
Message left to return call to [place] at [phone].   Mirena IUD.  Hx Abn Pap, colpo in 201/02/2014.

## 2019-01-13 NOTE — Telephone Encounter (Signed)
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  She needs colposcopy exam, please schedule. Order placed

## 2019-02-02 NOTE — Telephone Encounter (Signed)
Spoke with patient, advised as seen below per Leota Sauers, CNM. Patient familiar with colpo procedure, reviewed again, questions answered. IUD for contraceptive, reports no menses with IUD. Colpo scheduled for 3/17 at 1400 with Leota Sauers, CNM. Order previously placed for precert. Advised to take Motrin 800 mg with food and water one hour before procedure. Patient verbalizes understanding and is agreeable.    Routing to [employer] and [employer] for [employer].   Encounter closed.

## 2019-02-02 NOTE — Telephone Encounter (Signed)
Patient returned call

## 2019-02-02 NOTE — Telephone Encounter (Signed)
Message left to return call to Samantha Bird at [phone].    

## 2019-02-03 ENCOUNTER — Telehealth: Payer: Self-pay | Admitting: Certified Nurse Midwife

## 2019-02-08 ENCOUNTER — Ambulatory Visit: Payer: 59 | Admitting: Certified Nurse Midwife

## 2019-02-08 ENCOUNTER — Other Ambulatory Visit: Payer: Self-pay

## 2019-02-08 ENCOUNTER — Encounter: Payer: Self-pay | Admitting: Certified Nurse Midwife

## 2019-02-08 VITALS — BP 106/62 | HR 64 | Resp 16 | Wt 252.0 lb

## 2019-02-08 DIAGNOSIS — R87612 Low grade squamous intraepithelial lesion on cytologic smear of cervix (LGSIL): Secondary | ICD-10-CM

## 2019-02-08 DIAGNOSIS — N72 Inflammatory disease of cervix uteri: Secondary | ICD-10-CM | POA: Diagnosis not present

## 2019-02-08 DIAGNOSIS — N870 Mild cervical dysplasia: Secondary | ICD-10-CM | POA: Diagnosis not present

## 2019-02-08 DIAGNOSIS — R8789 Other abnormal findings in specimens from female genital organs: Secondary | ICD-10-CM

## 2019-02-08 DIAGNOSIS — R87618 Other abnormal cytological findings on specimens from cervix uteri: Secondary | ICD-10-CM

## 2019-02-08 NOTE — Progress Notes (Addendum)
Patient ID: [name], female   DOB: Jan 08, 1971, 48 y.o.   MRN: [id number]  Chief Complaint  Patient presents with  . Colposcopy    //jj    HPI Samantha Bird is a 48 y.o. female.  Here for colposcopy exam. Denies vaginal bleeding or pelvic pain.Mirena IUD contraception..   Indications: Pap smear on 01/07/2019  showed: low-grade squamous intraepithelial neoplasia (LGSIL - encompassing HPV,mild dysplasia,CIN I) and + HPV Previous colposcopy: in 10/12/13 with negative finding with ASCUS pap.. Prior cervical treatment: no treatment.  Past Medical History:  Diagnosis Date  . Abnormal Pap smear 2013   neg pap +HPV HR, 16/18 genotype neg, 12/2018 LGSIL HPV HR+  . Condyloma   . IBS (irritable bowel syndrome)   . Migraines    chronic,no aura    Past Surgical History:  Procedure Laterality Date  . AUGMENTATION MAMMAPLASTY Bilateral 2006  . BREAST ENHANCEMENT SURGERY    . COLPOSCOPY    . INTRAUTERINE DEVICE (IUD) INSERTION     inserted in 06,removed, then new one inserted 4/11, 12-14-15 mirena removed & another inserted  . TONSILLECTOMY AND ADENOIDECTOMY      Family History  Problem Relation Age of Onset  . Hypertension Brother   . Diabetes Mother   . Heart attack Father     Social History Social History   Tobacco Use  . Smoking status: Former Games developer  . Smokeless tobacco: Never Used  . Tobacco comment: for maybe 1 yr in college  Substance Use Topics  . Alcohol use: Yes    Alcohol/week: 0.0 - 2.0 standard drinks  . Drug use: No    Allergies  Allergen Reactions  . Penicillins Hives and Swelling    As a child     Current Outpatient Medications  Medication Sig Dispense Refill  . Apremilast (OTEZLA) 30 MG TABS Take by mouth.    . clobetasol ointment (TEMOVATE) 0.05 %     . cyclobenzaprine (FLEXERIL) 10 MG tablet     . levonorgestrel (MIRENA) 20 MCG/24HR IUD 1 each by Intrauterine route once.    . Multiple Vitamins-Minerals (MULTIVITAMIN PO) Take by mouth  as needed.    . Probiotic Product (PROBIOTIC PO) Take by mouth.    . Pyridoxine HCl (VITAMIN B6 PO) Take by mouth.    Marland Kitchen UNABLE TO FIND Apple cidar vinegar gummy    . UNABLE TO FIND Elderberry gummy    . VITAMIN D PO Take by mouth.     No current facility-administered medications for this visit.     Review of Systems Review of Systems  Constitutional: Negative.   HENT: Negative.   Eyes: Negative.   Respiratory: Negative.   Cardiovascular: Negative.   Gastrointestinal: Negative.   Endocrine: Negative.   Genitourinary: Negative.   Musculoskeletal: Negative.   Skin: Negative.   Allergic/Immunologic: Negative.   Neurological: Negative.   Hematological: Negative.   Psychiatric/Behavioral: Negative.     Blood pressure 106/62, pulse 64, resp. rate 16, weight 252 lb (114.3 kg).  Physical Exam Physical Exam Exam conducted with a chaperone present.  Constitutional:      Appearance: Normal appearance.  Cardiovascular:     Rate and Rhythm: Normal rate.  Pulmonary:     Effort: Pulmonary effort is normal.  Genitourinary:    General: Normal vulva.     Exam position: Lithotomy position.     Labia:        Right: No rash, tenderness or lesion.        Left:  No rash, tenderness or lesion.      Vagina: Normal.     Cervix: Normal.        Comments: IUD string no in cervical OS, intact when procedure completed Lymphadenopathy:     Lower Body: No right inguinal adenopathy. No left inguinal adenopathy.  Skin:    General: Skin is warm and dry.  Neurological:     Mental Status: She is alert and oriented to person, place, and time.  Psychiatric:        Attention and Perception: Attention and perception normal.        Mood and Affect: Mood and affect normal.        Behavior: Behavior normal. Behavior is cooperative.        Thought Content: Thought content normal.        Cognition and Memory: Cognition and memory normal.        Judgment: Judgment normal.     Data Reviewed Reviewed  pap smear results and questions addressed.  Assessment    Procedure Details  The risks and benefits of the procedure and Written informed consent obtained.  Speculum placed in vagina and excellent visualization of cervix achieved, cervix swabbed x 3 with acetic acid solution. Cervix viewed with 3.75, 7.5 and 15 # with green filter, acetowhite areas noted at 6 o'clock and 1 o'clock. Also so HPV effect noted on cervix in the same areas. Lugol's applied with light staining only noted in same areas. Biopsy taken at 6 o'clock and 1 o'clock. ECC obtained. Monsel' s applied and no active bleeding noted on speculum removal. Patient tolerated procedure well. Instructions written and verbal given. Voiced understanding. Patient escorted to check out without assistance in stable condition.  Specimens: 3  Complications: none.     Plan    Specimens labelled and sent to Pathology. Patient will be called with results once reviewed. Pathology showed biopsy at 6 and 1 o'clock with LSIL, CIN 1, low grade dysplasia, ECC showed scant cervical mucosa with atypia and benign cervical glandular mucosa negative for dysplasia or malignancy. The cervical mucosa showed inflammation and  could not rule our low grade dysplasia. Patient will be contacted regarding results and need for repeat pap smear in 6 months. 06 recall will be placed.      Samantha Bird 02/08/2019, 2:15 PM

## 2019-02-08 NOTE — Progress Notes (Signed)
01-07-2019 LGSIL HPV HR+, previous abnormal history. Pt took 1000mg  naproxen at 12pm

## 2019-02-08 NOTE — Patient Instructions (Signed)

## 2019-02-10 DIAGNOSIS — Z1211 Encounter for screening for malignant neoplasm of colon: Secondary | ICD-10-CM | POA: Diagnosis not present

## 2019-02-10 DIAGNOSIS — R194 Change in bowel habit: Secondary | ICD-10-CM | POA: Diagnosis not present

## 2019-02-23 DIAGNOSIS — Z79899 Other long term (current) drug therapy: Secondary | ICD-10-CM | POA: Diagnosis not present

## 2019-02-23 DIAGNOSIS — L408 Other psoriasis: Secondary | ICD-10-CM | POA: Diagnosis not present

## 2019-03-02 DIAGNOSIS — R194 Change in bowel habit: Secondary | ICD-10-CM | POA: Diagnosis not present

## 2019-03-24 ENCOUNTER — Telehealth: Payer: Self-pay | Admitting: Obstetrics and Gynecology

## 2019-03-24 NOTE — Telephone Encounter (Signed)
ERRONEOUS ENCOUNTER

## 2019-04-08 ENCOUNTER — Other Ambulatory Visit: Payer: Self-pay

## 2019-06-02 ENCOUNTER — Other Ambulatory Visit: Payer: Self-pay

## 2019-06-10 ENCOUNTER — Other Ambulatory Visit: Payer: Self-pay

## 2019-06-10 ENCOUNTER — Other Ambulatory Visit (INDEPENDENT_AMBULATORY_CARE_PROVIDER_SITE_OTHER): Payer: 59

## 2019-06-10 DIAGNOSIS — E7889 Other lipoprotein metabolism disorders: Secondary | ICD-10-CM

## 2019-06-11 LAB — LIPID PANEL
Chol/HDL Ratio: 5 ratio — ABNORMAL HIGH (ref 0.0–4.4)
Cholesterol, Total: 226 mg/dL — ABNORMAL HIGH (ref 100–199)
HDL: 45 mg/dL (ref >39)
LDL Calculated: 143 mg/dL — ABNORMAL HIGH (ref 0–99)
Triglycerides: 190 mg/dL — ABNORMAL HIGH (ref 0–149)
VLDL Cholesterol Cal: 38 mg/dL (ref 5–40)

## 2019-06-13 ENCOUNTER — Telehealth: Payer: Self-pay | Admitting: *Deleted

## 2019-06-13 DIAGNOSIS — E7889 Other lipoprotein metabolism disorders: Secondary | ICD-10-CM

## 2019-06-13 NOTE — Telephone Encounter (Signed)
Call to patient. Results reviewed with patient and she verbalized understanding. Patient does not currently have a PCP. Requests referral. States she lives in [place] area. RN advised would place referral and office coordinator would be in touch to schedule. Patient agreeable.   Routing to provider and will close encounter.

## 2019-06-13 NOTE — Telephone Encounter (Signed)
-----   Message from Regina Eck, CNM sent at 06/12/2019 11:02 AM EDT ----- Notify patient her cholesterol is better at 226 from 247 and LDL has decreased from 154 to 143, but triglycerides have increased from 162 to 190.  Due to risk ratio at 5.0 feel she needs to be seen by PCP for management. Can refer her if she does not have PCP.

## 2019-07-06 ENCOUNTER — Telehealth: Payer: Self-pay | Admitting: Certified Nurse Midwife

## 2019-07-06 NOTE — Telephone Encounter (Signed)
Call placed to follow up with patient on referral.

## 2019-07-20 HISTORY — PX: COLONOSCOPY: SHX174

## 2019-08-10 ENCOUNTER — Other Ambulatory Visit: Payer: Self-pay

## 2019-08-11 ENCOUNTER — Telehealth: Payer: Self-pay | Admitting: Certified Nurse Midwife

## 2019-08-11 NOTE — Telephone Encounter (Signed)
Patient's 6 month appointment 08/12/19 has been canceled by NP. I spoke with the patient, she will call later to reschedule.

## 2019-08-12 ENCOUNTER — Ambulatory Visit: Payer: 59 | Admitting: Certified Nurse Midwife

## 2019-08-12 NOTE — Progress Notes (Signed)
Review of Systems  Constitutional: Negative.   HENT: Negative.   Eyes: Negative.   Respiratory: Negative.   Cardiovascular: Negative.   Gastrointestinal: Negative.   Genitourinary: Negative.   Musculoskeletal: Negative.   Skin: Negative.   Neurological: Negative.   Endo/Heme/Allergies: Negative.   Psychiatric/Behavioral: Negative.     48 y.o. G62P0000 Divorced Caucasian Female here for repeat Pap smear due to history of low-grade squamous intraepithelial neoplasia (LGSIL - encompassing HPV,mild dysplasia,CIN I) on pap done 01/07/2019 and low-grade squamous intraepithelial neoplasia (LGSIL - encompassing HPV,mild dysplasia,CIN I) and  Cervical mucosa with atypia and benign cervical glandular mucosa negative for dysplasia on  colpo done 02/08/2019.  No health changes, no periods with IUD. Had colonoscopy which was negative. Today she denies vaginal symptoms or STD concerns.  No LMP recorded. (Menstrual status: IUD)..  Contraception: Mirena IUD  EXAM: BP 118/72 P-68 R-16  Weight 249 General appearance:  WNWD Female, NAD  Pelvic exam: VULVA: normal appearing vulva with no masses, tenderness or lesions, VAGINA: normal appearing vagina with normal color and discharge, no lesions,  CERVIX: normal appearing cervix without discharge or lesions, cervical motion tenderness absent, IUD string noted in cervical os, UTERUS: uterus is normal size, shape, consistency and nontender,  ADNEXA: normal adnexa in size, nontender and no masses. RECTAL: no lesions normal appearance Pap smear obtained.  Assessment: History of low-grade squamous intraepithelial neoplasia (LGSIL - encompassing HPV,mild dysplasia,CIN I) and cervical mucosa atypia  Plan: Repeat Pap 6 months with aex unless otherwise indicated by results. Discussed importance of follow up as indicated. Questions addressed. Lab: Pap smear  Rv prn as above

## 2019-08-15 ENCOUNTER — Encounter: Payer: Self-pay | Admitting: Certified Nurse Midwife

## 2019-08-15 ENCOUNTER — Ambulatory Visit: Payer: 59 | Admitting: Certified Nurse Midwife

## 2019-08-15 ENCOUNTER — Other Ambulatory Visit (HOSPITAL_COMMUNITY)
Admission: RE | Admit: 2019-08-15 | Discharge: 2019-08-15 | Disposition: A | Discharge status: Home / Self Care | Payer: 59 | Source: Ambulatory Visit | Attending: Certified Nurse Midwife | Admitting: Certified Nurse Midwife

## 2019-08-15 ENCOUNTER — Other Ambulatory Visit: Payer: Self-pay

## 2019-08-15 VITALS — BP 118/72 | HR 68 | Temp 97.0°F | Resp 16 | Wt 249.0 lb

## 2019-08-15 DIAGNOSIS — Z8742 Personal history of other diseases of the female genital tract: Secondary | ICD-10-CM | POA: Diagnosis present

## 2019-08-16 ENCOUNTER — Ambulatory Visit: Payer: 59 | Admitting: Sports Medicine

## 2019-08-16 ENCOUNTER — Encounter: Payer: Self-pay | Admitting: Sports Medicine

## 2019-08-16 DIAGNOSIS — M47816 Spondylosis without myelopathy or radiculopathy, lumbar region: Secondary | ICD-10-CM | POA: Diagnosis not present

## 2019-08-16 MED ORDER — NAPROXEN 500 MG PO TABS: 500.0000 mg | ORAL_TABLET | Freq: Two times a day (BID) | ORAL | 3 refills | Status: AC

## 2019-08-16 MED ORDER — CYCLOBENZAPRINE HCL 10 MG PO TABS: 10.0000 mg | ORAL_TABLET | Freq: Every day | ORAL | 3 refills | Status: DC

## 2019-08-16 NOTE — Progress Notes (Signed)
Subjective:    CC: Low back pain  HPI:  This is a very pleasant 48 year old female with multifactorial low back pain, she has historically done extremely well with epidurals a couple of times year and most recently a left L4-L5 facet injections are provided good relief.  She has never had medial branch blocks or radiofrequency ablation.  Historically Dr. Lynann Bologna with [facility] was managing her back pain, she has done a great job avoiding narcotics, Dhanvi only uses an occasional naproxen, Flexeril and has the injections.  Her orthopedist is retiring and it was recommended by [facility] imaging that she switch her spine care to [place].  Pain is axial, mostly discogenic but also with a facet agenic component, no bowel or bladder dysfunction, saddle numbness, or constitutional symptoms.  I reviewed the past medical history, family history, social history, surgical history, and allergies today and no changes were needed.  Please see the problem list section below in epic for further details.  Past Medical History: Past Medical History:  Diagnosis Date  . Abnormal Pap smear 2013   neg pap +HPV HR, 16/18 genotype neg, 12/2018 LGSIL HPV HR+  . Condyloma   . IBS (irritable bowel syndrome)   . Migraines    chronic,no aura   Past Surgical History: Past Surgical History:  Procedure Laterality Date  . AUGMENTATION MAMMAPLASTY Bilateral 2006  . BREAST ENHANCEMENT SURGERY    . COLPOSCOPY    . INTRAUTERINE DEVICE (IUD) INSERTION     inserted in 06,removed, then new one inserted 4/11, 12-14-15 mirena removed & another inserted  . TONSILLECTOMY AND ADENOIDECTOMY     Social History: Social History   Socioeconomic History  . Marital status: Divorced    Spouse name: Not on file  . Number of children: Not on file  . Years of education: Not on file  . Highest education level: Not on file  Occupational History  . Not on file  Social Needs  . Financial resource strain: Not on file  .  Food insecurity    Worry: Not on file    Inability: Not on file  . Transportation needs    Medical: Not on file    Non-medical: Not on file  Tobacco Use  . Smoking status: Former Research scientist (life sciences)  . Smokeless tobacco: Never Used  . Tobacco comment: for maybe 1 yr in college  Substance and Sexual Activity  . Alcohol use: Yes    Alcohol/week: 0.0 - 2.0 standard drinks  . Drug use: No  . Sexual activity: Yes    Partners: Male    Birth control/protection: I.U.D.  Lifestyle  . Physical activity    Days per week: Not on file    Minutes per session: Not on file  . Stress: Not on file  Relationships  . Social Herbalist on phone: Not on file    Gets together: Not on file    Attends religious service: Not on file    Active member of club or organization: Not on file    Attends meetings of clubs or organizations: Not on file    Relationship status: Not on file  Other Topics Concern  . Not on file  Social History Narrative  . Not on file   Family History: Family History  Problem Relation Age of Onset  . Hypertension Brother   . Diabetes Mother   . Heart attack Father    Allergies: Allergies  Allergen Reactions  . Penicillins Hives and Swelling  As a child    Medications: See med rec.  Review of Systems: No headache, visual changes, nausea, vomiting, diarrhea, constipation, dizziness, abdominal pain, skin rash, fevers, chills, night sweats, swollen lymph nodes, weight loss, chest pain, body aches, joint swelling, muscle aches, shortness of breath, mood changes, visual or auditory hallucinations.  Objective:    General: Well Developed, well nourished, and in no acute distress.  Neuro: Alert and oriented x3, extra-ocular muscles intact, sensation grossly intact.  HEENT: Normocephalic, atraumatic, pupils equal round reactive to light, neck supple, no masses, no lymphadenopathy, thyroid nonpalpable.  Skin: Warm and dry, no rashes noted.  Cardiac: Regular rate and rhythm,  no murmurs rubs or gallops.  Respiratory: Clear to auscultation bilaterally. Not using accessory muscles, speaking in full sentences.  Abdominal: Soft, nontender, nondistended, positive bowel sounds, no masses, no organomegaly.  Back Exam:  Inspection: Unremarkable  Motion: Flexion 45 deg, Extension 45 deg, Side Bending to 45 deg bilaterally,  Rotation to 45 deg bilaterally  SLR laying: Negative  XSLR laying: Negative  Palpable tenderness: None. FABER: negative. Sensory change: Gross sensation intact to all lumbar and sacral dermatomes.  Reflexes: 2+ at both patellar tendons, 2+ at achilles tendons, Babinski's downgoing.  Strength at foot  Plantar-flexion: 5/5 Dorsi-flexion: 5/5 Eversion: 5/5 Inversion: 5/5  Leg strength  Quad: 5/5 Hamstring: 5/5 Hip flexor: 5/5 Hip abductors: 5/5  Gait unremarkable.  Impression and Recommendations:    The patient was counselled, risk factors were discussed, anticipatory guidance given.  Lumbar spondylosis Shyasia has multifactorial low back pain, she has L4-L5 DDD, she has been getting left L4-L5 interlaminar epidurals occasionally, ordering another one now with Dr. Alfredo Batty. She also had good relief with a left L4-L5 facet joint injection. I would like to start with a left L4-L5 interlaminar, see her back in a month and depending on what pain she has left we may or may not order another left L4-L5 intervention. We did discuss the possibility of left L4-L5 medial branch blocks followed by radiofrequency ablation for longer lasting solution for the facet mediated component of her pain. Her last MRI was about a year ago at [facility].   ___________________________________________ Ihor Austin. Benjamin Stain, M.D., ABFM., CAQSM. Primary Care and Sports Medicine [facility]  Adjunct Professor of Family Medicine  [employer]

## 2019-08-16 NOTE — Assessment & Plan Note (Signed)
Samantha Bird has multifactorial low back pain, she has L4-L5 DDD, she has been getting left L4-L5 interlaminar epidurals occasionally, ordering another one now with Dr. Jobe Igo. She also had good relief with a left L4-L5 facet joint injection. I would like to start with a left L4-L5 interlaminar, see her back in a month and depending on what pain she has left we may or may not order another left L4-L5 intervention. We did discuss the possibility of left L4-L5 medial branch blocks followed by radiofrequency ablation for longer lasting solution for the facet mediated component of her pain. Her last MRI was about a year ago at [facility].

## 2019-08-17 LAB — CYTOLOGY - PAP
High risk HPV: POSITIVE — AB
Molecular Disclaimer: 56
Molecular Disclaimer: DETECTED
Molecular Disclaimer: NORMAL

## 2019-08-19 ENCOUNTER — Other Ambulatory Visit: Payer: Self-pay

## 2019-08-19 ENCOUNTER — Ambulatory Visit
Admission: RE | Admit: 2019-08-19 | Discharge: 2019-08-19 | Disposition: A | Discharge status: Home / Self Care | Payer: 59 | Source: Ambulatory Visit | Attending: Sports Medicine | Admitting: Sports Medicine

## 2019-08-19 MED ORDER — METHYLPREDNISOLONE ACETATE 40 MG/ML INJ SUSP (RADIOLOG
120.0000 mg | Freq: Once | INTRAMUSCULAR | Status: AC
  Administered 2019-08-19: 120 mg via EPIDURAL

## 2019-08-19 MED ORDER — IOPAMIDOL (ISOVUE-M 200) INJECTION 41%
1.0000 mL | Freq: Once | INTRAMUSCULAR | Status: AC
  Administered 2019-08-19: 1 mL via EPIDURAL

## 2019-08-23 ENCOUNTER — Other Ambulatory Visit: Payer: Self-pay | Admitting: Certified Nurse Midwife

## 2019-08-23 DIAGNOSIS — Z1231 Encounter for screening mammogram for malignant neoplasm of breast: Secondary | ICD-10-CM

## 2019-09-19 ENCOUNTER — Ambulatory Visit (INDEPENDENT_AMBULATORY_CARE_PROVIDER_SITE_OTHER): Payer: 59 | Admitting: Sports Medicine

## 2019-09-19 ENCOUNTER — Encounter: Payer: Self-pay | Admitting: Sports Medicine

## 2019-09-19 ENCOUNTER — Other Ambulatory Visit: Payer: Self-pay

## 2019-09-19 DIAGNOSIS — M47816 Spondylosis without myelopathy or radiculopathy, lumbar region: Secondary | ICD-10-CM | POA: Diagnosis not present

## 2019-09-19 NOTE — Progress Notes (Signed)
Subjective:    CC: Follow-up  HPI: This is a pleasant 48 year old female with lumbar DDD, she also has lumbar facet arthritis, she responded extremely well to a left L4-L5 interlaminar epidural, she has a bit of residual pain and desires epidural #2 of 3.  I reviewed the past medical history, family history, social history, surgical history, and allergies today and no changes were needed.  Please see the problem list section below in epic for further details.  Past Medical History: Past Medical History:  Diagnosis Date  . Abnormal Pap smear 2013   neg pap +HPV HR, 16/18 genotype neg, 12/2018 LGSIL HPV HR+  . Condyloma   . IBS (irritable bowel syndrome)   . Migraines    chronic,no aura   Past Surgical History: Past Surgical History:  Procedure Laterality Date  . AUGMENTATION MAMMAPLASTY Bilateral 2006  . BREAST ENHANCEMENT SURGERY    . COLPOSCOPY    . INTRAUTERINE DEVICE (IUD) INSERTION     inserted in 06,removed, then new one inserted 4/11, 12-14-15 mirena removed & another inserted  . TONSILLECTOMY AND ADENOIDECTOMY     Social History: Social History   Socioeconomic History  . Marital status: Divorced    Spouse name: Not on file  . Number of children: Not on file  . Years of education: Not on file  . Highest education level: Not on file  Occupational History  . Not on file  Social Needs  . Financial resource strain: Not on file  . Food insecurity    Worry: Not on file    Inability: Not on file  . Transportation needs    Medical: Not on file    Non-medical: Not on file  Tobacco Use  . Smoking status: Former Games developer  . Smokeless tobacco: Never Used  . Tobacco comment: for maybe 1 yr in college  Substance and Sexual Activity  . Alcohol use: Yes    Alcohol/week: 0.0 - 2.0 standard drinks  . Drug use: No  . Sexual activity: Yes    Partners: Male    Birth control/protection: I.U.D.  Lifestyle  . Physical activity    Days per week: Not on file    Minutes per  session: Not on file  . Stress: Not on file  Relationships  . Social Musician on phone: Not on file    Gets together: Not on file    Attends religious service: Not on file    Active member of club or organization: Not on file    Attends meetings of clubs or organizations: Not on file    Relationship status: Not on file  Other Topics Concern  . Not on file  Social History Narrative  . Not on file   Family History: Family History  Problem Relation Age of Onset  . Hypertension Brother   . Diabetes Mother   . Heart attack Father    Allergies: Allergies  Allergen Reactions  . Penicillins Hives and Swelling    As a child    Medications: See med rec.  Review of Systems: No fevers, chills, night sweats, weight loss, chest pain, or shortness of breath.   Objective:    General: Well Developed, well nourished, and in no acute distress.  Neuro: Alert and oriented x3, extra-ocular muscles intact, sensation grossly intact.  HEENT: Normocephalic, atraumatic, pupils equal round reactive to light, neck supple, no masses, no lymphadenopathy, thyroid nonpalpable.  Skin: Warm and dry, no rashes. Cardiac: Regular rate and rhythm, no murmurs  rubs or gallops, no lower extremity edema.  Respiratory: Clear to auscultation bilaterally. Not using accessory muscles, speaking in full sentences.  Impression and Recommendations:    Lumbar spondylosis Fantastic response to left L4-L5 interlaminar epidural, she has a bit of residual axial discogenic pain, worse with sitting, ordering epidural #2. In the past she has also had good relief with a left L4-L5 facet injection, we will keep this on the back burner for now. Her MRI is a bit old, considering epidural planning we will order a new MRI. She can just call me if she needs epidural #3.   ___________________________________________ Gwen Her. Dianah Field, M.D., ABFM., CAQSM. Primary Care and Sports Medicine [facility]  [facility]  Adjunct Professor of [employer]

## 2019-09-19 NOTE — Assessment & Plan Note (Signed)
Fantastic response to left L4-L5 interlaminar epidural, she has a bit of residual axial discogenic pain, worse with sitting, ordering epidural #2. In the past she has also had good relief with a left L4-L5 facet injection, we will keep this on the back burner for now. Her MRI is a bit old, considering epidural planning we will order a new MRI. She can just call me if she needs epidural #3.

## 2019-09-27 ENCOUNTER — Ambulatory Visit (INDEPENDENT_AMBULATORY_CARE_PROVIDER_SITE_OTHER): Payer: 59

## 2019-09-27 ENCOUNTER — Other Ambulatory Visit: Payer: Self-pay

## 2019-09-27 ENCOUNTER — Telehealth: Payer: Self-pay | Admitting: Family Medicine

## 2019-09-27 DIAGNOSIS — M47816 Spondylosis without myelopathy or radiculopathy, lumbar region: Secondary | ICD-10-CM | POA: Diagnosis not present

## 2019-09-27 NOTE — Telephone Encounter (Signed)
ERROR

## 2019-09-29 ENCOUNTER — Telehealth: Payer: Self-pay | Admitting: Certified Nurse Midwife

## 2019-09-29 NOTE — Telephone Encounter (Signed)
Patient is asking to talk with [facility]. She was unable to reach her on the direct number.

## 2019-10-10 ENCOUNTER — Ambulatory Visit: Payer: 59

## 2019-10-18 NOTE — Telephone Encounter (Signed)
Patient returned call. Says she is having problems getting through to the direct number.

## 2019-10-18 NOTE — Telephone Encounter (Signed)
Call placed to follow up with patient in regards to her referral.

## 2019-10-19 ENCOUNTER — Ambulatory Visit
Admission: RE | Admit: 2019-10-19 | Discharge: 2019-10-19 | Disposition: A | Discharge status: Home / Self Care | Payer: 59 | Source: Ambulatory Visit | Attending: Sports Medicine | Admitting: Sports Medicine

## 2019-10-19 MED ORDER — METHYLPREDNISOLONE ACETATE 40 MG/ML INJ SUSP (RADIOLOG
120.0000 mg | Freq: Once | INTRAMUSCULAR | Status: AC
  Administered 2019-10-19: 120 mg via EPIDURAL

## 2019-10-19 MED ORDER — IOPAMIDOL (ISOVUE-M 200) INJECTION 41%
1.0000 mL | Freq: Once | INTRAMUSCULAR | Status: AC
  Administered 2019-10-19: 1 mL via EPIDURAL

## 2019-11-01 DIAGNOSIS — E785 Hyperlipidemia, unspecified: Secondary | ICD-10-CM | POA: Insufficient documentation

## 2019-11-01 DIAGNOSIS — Z8669 Personal history of other diseases of the nervous system and sense organs: Secondary | ICD-10-CM | POA: Insufficient documentation

## 2019-11-01 DIAGNOSIS — E669 Obesity, unspecified: Secondary | ICD-10-CM | POA: Insufficient documentation

## 2019-11-08 NOTE — Telephone Encounter (Signed)
Okay to close this encounter? °

## 2019-11-30 ENCOUNTER — Other Ambulatory Visit: Payer: Self-pay

## 2019-11-30 ENCOUNTER — Ambulatory Visit
Admission: RE | Admit: 2019-11-30 | Discharge: 2019-11-30 | Disposition: A | Discharge status: Home / Self Care | Payer: 59 | Source: Ambulatory Visit | Attending: Certified Nurse Midwife | Admitting: Certified Nurse Midwife

## 2019-11-30 DIAGNOSIS — Z1231 Encounter for screening mammogram for malignant neoplasm of breast: Secondary | ICD-10-CM

## 2019-11-30 NOTE — Progress Notes (Signed)
Mammogram negative  repeat yearly Density B

## 2020-01-10 NOTE — Progress Notes (Deleted)
49 y.o. G0P0000 Divorced  Caucasian Fe here for annual exam.    No LMP recorded. (Menstrual status: IUD).          Sexually active: {yes no:314532}  The current method of family planning is IUD.    Exercising: {yes no:314532}  {types:19826} Smoker:  {YES NO:22349}  ROS  Health Maintenance: Pap:  11-28-15 neg, 01-07-2019 LGSIL HPV HR+,  08-15-2019 LGSIL HPV HR+ History of Abnormal Pap: yes MMG:  11-30-2019 category b density birads 1:neg Self Breast exams: {YES NO:22349} Colonoscopy:  2020 BMD:   none TDaP:  2012 Shingles: no Pneumonia: no Hep C and HIV: both neg 2017 Labs: ***   reports that she has quit smoking. She has never used smokeless tobacco. She reports current alcohol use. She reports that she does not use drugs.  Past Medical History:  Diagnosis Date  . Abnormal Pap smear 2013   neg pap +HPV HR, 16/18 genotype neg, 12/2018 LGSIL HPV HR+  . Condyloma   . IBS (irritable bowel syndrome)   . Migraines    chronic,no aura    Past Surgical History:  Procedure Laterality Date  . AUGMENTATION MAMMAPLASTY Bilateral 2006  . BREAST ENHANCEMENT SURGERY    . COLPOSCOPY    . INTRAUTERINE DEVICE (IUD) INSERTION     inserted in 06,removed, then new one inserted 4/11, 12-14-15 mirena removed & another inserted  . TONSILLECTOMY AND ADENOIDECTOMY      Current Outpatient Medications  Medication Sig Dispense Refill  . Apremilast (OTEZLA) 30 MG TABS Take by mouth.    . clobetasol ointment (TEMOVATE) 0.05 %     . cyclobenzaprine (FLEXERIL) 10 MG tablet Take 1 tablet (10 mg total) by mouth at bedtime. 90 tablet 3  . levonorgestrel (MIRENA) 20 MCG/24HR IUD 1 each by Intrauterine route once.    . Multiple Vitamins-Minerals (MULTIVITAMIN PO) Take by mouth as needed.    . naproxen (NAPROSYN) 500 MG tablet Take 1 tablet (500 mg total) by mouth 2 (two) times daily with a meal. 180 tablet 3  . Probiotic Product (PROBIOTIC PO) Take by mouth.    . Pyridoxine HCl (VITAMIN B6 PO) Take by mouth.     Marland Kitchen UNABLE TO FIND Apple cidar vinegar gummy    . UNABLE TO FIND Elderberry gummy    . VITAMIN D PO Take by mouth.     No current facility-administered medications for this visit.    Family History  Problem Relation Age of Onset  . Hypertension Brother   . Diabetes Mother   . Heart attack Father     ROS:  Pertinent items are noted in HPI.  Otherwise, a comprehensive ROS was negative.  Exam:   There were no vitals taken for this visit.   Ht Readings from Last 3 Encounters:  09/19/19 5\' 8"  (1.727 m)  01/07/19 5\' 8"  (1.727 m)  01/01/18 5' 8.25" (1.734 m)    General appearance: alert, cooperative and appears stated age Head: Normocephalic, without obvious abnormality, atraumatic Neck: no adenopathy, supple, symmetrical, trachea midline and thyroid {EXAM; THYROID:18604} Lungs: clear to auscultation bilaterally Breasts: {Exam; breast:13139::"normal appearance, no masses or tenderness"} Heart: regular rate and rhythm Abdomen: soft, non-tender; no masses,  no organomegaly Extremities: extremities normal, atraumatic, no cyanosis or edema Skin: Skin color, texture, turgor normal. No rashes or lesions Lymph nodes: Cervical, supraclavicular, and axillary nodes normal. No abnormal inguinal nodes palpated Neurologic: Grossly normal   Pelvic: External genitalia:  no lesions  Urethra:  normal appearing urethra with no masses, tenderness or lesions              Bartholin's and Skene's: normal                 Vagina: normal appearing vagina with normal color and discharge, no lesions              Cervix: {exam; cervix:14595}              Pap taken: {yes no:314532} Bimanual Exam:  Uterus:  {exam; uterus:12215}              Adnexa: {exam; adnexa:12223}               Rectovaginal: Confirms               Anus:  normal sphincter tone, no lesions  Chaperone present: ***  A:  Well Woman with normal exam  P:   Reviewed health and wellness pertinent to exam  Pap smear: {YES  NO:22349}  {plan; gyn:5269::"mammogram","pap smear","return annually or prn"}  An After Visit Summary was printed and given to the patient.

## 2020-01-13 ENCOUNTER — Ambulatory Visit: Payer: 59 | Admitting: Certified Nurse Midwife

## 2020-01-30 ENCOUNTER — Ambulatory Visit: Payer: 59 | Admitting: Certified Nurse Midwife

## 2020-02-15 ENCOUNTER — Ambulatory Visit: Payer: 59 | Admitting: Certified Nurse Midwife

## 2020-02-15 ENCOUNTER — Encounter: Payer: Self-pay | Admitting: Certified Nurse Midwife

## 2020-03-02 ENCOUNTER — Other Ambulatory Visit: Payer: Self-pay

## 2020-03-02 NOTE — Progress Notes (Signed)
49 y.o. G0P0000 Divorced White or Caucasian female here for annual exam.  Doing well.  Denies vaginal bleeding.  Has Mirena IUD in place.  New FDA indication discussed.    PCP, Dr. Suzanna Obey.  Has blood work scheduled in June.  Cholesterol is being followed.    No LMP recorded. (Menstrual status: IUD).          Sexually active: Yes.    The current method of family planning is IUD.   Inserted 12-14-15 Exercising: No.  exericise Smoker:  no  Health Maintenance: Pap:  11-28-15 neg, 08-15-2019 LGSIL HPV HR + History of abnormal Pap:  yes MMG:  11-30-2019 category b density birads 1:neg Colonoscopy:  2020 f/u 10 yrs BMD:   none TDaP:  2012 Pneumonia vaccine(s):  No Shingrix: No Hep C testing: neg 2017 Screening Labs: with PCP in June   reports that she has quit smoking. She has never used smokeless tobacco. She reports current alcohol use. She reports that she does not use drugs.  Past Medical History:  Diagnosis Date  . Abnormal Pap smear 2013   neg pap +HPV HR, 16/18 genotype neg, 12/2018 LGSIL HPV HR+  . Condyloma   . IBS (irritable bowel syndrome)   . Migraines    chronic,no aura    Past Surgical History:  Procedure Laterality Date  . AUGMENTATION MAMMAPLASTY Bilateral 2006  . BREAST ENHANCEMENT SURGERY    . COLPOSCOPY    . INTRAUTERINE DEVICE (IUD) INSERTION     inserted in 06,removed, then new one inserted 4/11, 12-14-15 mirena removed & another inserted  . TONSILLECTOMY AND ADENOIDECTOMY      Current Outpatient Medications  Medication Sig Dispense Refill  . Apremilast (OTEZLA) 30 MG TABS Take by mouth.    . ASPIRIN 81 PO Take by mouth.    . clobetasol ointment (TEMOVATE) 0.05 %     . cyclobenzaprine (FLEXERIL) 10 MG tablet Take 1 tablet (10 mg total) by mouth at bedtime. 90 tablet 3  . levonorgestrel (MIRENA) 20 MCG/24HR IUD 1 each by Intrauterine route once.    . Multiple Vitamins-Minerals (MULTIVITAMIN PO) Take by mouth as needed.    . naproxen (NAPROSYN) 500 MG  tablet Take 1 tablet (500 mg total) by mouth 2 (two) times daily with a meal. 180 tablet 3  . nitrofurantoin, macrocrystal-monohydrate, (MACROBID) 100 MG capsule Take 100 mg by mouth 2 (two) times daily.    . Probiotic Product (PROBIOTIC PO) Take by mouth.    . Pyridoxine HCl (VITAMIN B6 PO) Take by mouth.    . Turmeric (QC TUMERIC COMPLEX PO) Take by mouth.    Marland Kitchen UNABLE TO FIND Apple cidar vinegar gummy    . UNABLE TO FIND Elderberry gummy    . VITAMIN D PO Take by mouth.     No current facility-administered medications for this visit.    Family History  Problem Relation Age of Onset  . Hypertension Brother   . Diabetes Mother   . Heart attack Father     Review of Systems  Constitutional: Negative.   HENT: Negative.   Eyes: Negative.   Respiratory: Negative.   Cardiovascular: Negative.   Gastrointestinal: Negative.   Endocrine: Negative.   Genitourinary: Negative.   Musculoskeletal: Negative.   Skin: Negative.   Allergic/Immunologic: Negative.   Neurological: Negative.   Psychiatric/Behavioral: Negative.     Exam:   Vitals:   03/05/20 0857  BP: 120/74  Pulse: 68  Resp: 16  Temp: (!) 97.5 F (36.4  C)   General appearance: alert, cooperative and appears stated age Head: Normocephalic, without obvious abnormality, atraumatic Neck: no adenopathy, supple, symmetrical, trachea midline and thyroid normal to inspection and palpation Lungs: clear to auscultation bilaterally Breasts: normal appearance, no masses or tenderness Heart: regular rate and rhythm Abdomen: soft, non-tender; bowel sounds normal; no masses,  no organomegaly Extremities: extremities normal, atraumatic, no cyanosis or edema Skin: Skin color, texture, turgor normal. No rashes or lesions Lymph nodes: Cervical, supraclavicular, and axillary nodes normal. No abnormal inguinal nodes palpated Neurologic: Grossly normal   Pelvic: External genitalia:  no lesions              Urethra:  normal appearing  urethra with no masses, tenderness or lesions              Bartholins and Skenes: normal                 Vagina: normal appearing vagina with normal color and discharge, no lesions              Cervix: no lesions              Pap taken: Yes.   Bimanual Exam:  Uterus:  normal size, contour, position, consistency, mobility, non-tender              Adnexa: normal adnexa and no mass, fullness, tenderness               Rectovaginal: Confirms               Anus:  normal sphincter tone, no lesions  Chaperone, Cornelia Copa, CMA, was present for exam.  A:  Well Woman with normal exam Mirena IUD placed 11/2015 Psoriasis  P:   Mammogram guidelines reviewed.  Doing yearly. pap smear with HR HPV obtained Lab work will be done with PCP Pt aware IUD due for removal 6 years after placement Colonoscopy UTD Shingrix vaccination Return annually or prn

## 2020-03-05 ENCOUNTER — Other Ambulatory Visit (HOSPITAL_COMMUNITY)
Admission: RE | Admit: 2020-03-05 | Discharge: 2020-03-05 | Disposition: A | Discharge status: Home / Self Care | Payer: 59 | Source: Ambulatory Visit | Attending: Obstetrics & Gynecology | Admitting: Obstetrics & Gynecology

## 2020-03-05 ENCOUNTER — Other Ambulatory Visit: Payer: Self-pay

## 2020-03-05 ENCOUNTER — Encounter: Payer: Self-pay | Admitting: Obstetrics & Gynecology

## 2020-03-05 ENCOUNTER — Ambulatory Visit: Payer: 59 | Admitting: Obstetrics & Gynecology

## 2020-03-05 VITALS — BP 120/74 | HR 68 | Temp 97.5°F | Resp 16 | Ht 69.25 in | Wt 255.0 lb

## 2020-03-05 DIAGNOSIS — Z124 Encounter for screening for malignant neoplasm of cervix: Secondary | ICD-10-CM | POA: Insufficient documentation

## 2020-03-05 DIAGNOSIS — Z01419 Encounter for gynecological examination (general) (routine) without abnormal findings: Secondary | ICD-10-CM

## 2020-03-05 DIAGNOSIS — B977 Papillomavirus as the cause of diseases classified elsewhere: Secondary | ICD-10-CM | POA: Diagnosis present

## 2020-03-07 LAB — CYTOLOGY - PAP
Adequacy: ABSENT
Comment: NEGATIVE
High risk HPV: POSITIVE — AB

## 2020-03-13 ENCOUNTER — Other Ambulatory Visit: Payer: Self-pay

## 2020-03-13 DIAGNOSIS — R87612 Low grade squamous intraepithelial lesion on cytologic smear of cervix (LGSIL): Secondary | ICD-10-CM

## 2020-03-13 DIAGNOSIS — B977 Papillomavirus as the cause of diseases classified elsewhere: Secondary | ICD-10-CM

## 2020-03-13 NOTE — Telephone Encounter (Signed)
VOID

## 2020-03-13 NOTE — Progress Notes (Signed)
Colposcopy order placed

## 2020-03-14 ENCOUNTER — Telehealth: Payer: Self-pay | Admitting: Obstetrics & Gynecology

## 2020-03-14 NOTE — Telephone Encounter (Signed)
Spoke with patient regarding benefits for scheduled Colposcopy. Patient acknowledges understanding of information presented. Patient is aware of cancellation policy.

## 2020-03-14 NOTE — Telephone Encounter (Signed)
Patient is returning call.  °

## 2020-03-14 NOTE — Telephone Encounter (Signed)
Call to patient. Per DPR, OK to leave message on voicemail.   Left voicemail requesting a return call to [facility] to review benefits scheduled Colposcopy with M. Leda Quail, MD

## 2020-04-02 ENCOUNTER — Telehealth: Payer: Self-pay

## 2020-04-02 DIAGNOSIS — M47816 Spondylosis without myelopathy or radiculopathy, lumbar region: Secondary | ICD-10-CM

## 2020-04-02 NOTE — Telephone Encounter (Signed)
Jobeth called and left a message requesting an order for another epidural. Please advise.

## 2020-04-02 NOTE — Telephone Encounter (Signed)
Epidural ordered, please contact [facility] for scheduling. 

## 2020-04-03 ENCOUNTER — Telehealth: Payer: Self-pay | Admitting: *Deleted

## 2020-04-03 NOTE — Telephone Encounter (Signed)
Call placed to patient she requested June 14th for colposcopy. Appointment scheduled 05/07/20@10 :30 with Dr. Hyacinth Meeker. Please advise if ok.

## 2020-04-03 NOTE — Telephone Encounter (Signed)
03/05/20 Pap LSIL, positive hpv.  Hx abnormal paps  Dr. Hyacinth Meeker -ok to defer colpo until June 2021?

## 2020-04-03 NOTE — Telephone Encounter (Signed)
Routing to [employer].   Encounter closed.

## 2020-04-03 NOTE — Telephone Encounter (Signed)
[facility] advised °

## 2020-04-03 NOTE — Telephone Encounter (Signed)
Patient cancelled colpo and would like a call to reschedule in June.

## 2020-04-03 NOTE — Telephone Encounter (Signed)
This is fine. Thanks

## 2020-04-09 ENCOUNTER — Ambulatory Visit: Payer: 59 | Admitting: Obstetrics & Gynecology

## 2020-04-16 ENCOUNTER — Other Ambulatory Visit: Payer: Self-pay | Admitting: Sports Medicine

## 2020-04-16 ENCOUNTER — Ambulatory Visit
Admission: RE | Admit: 2020-04-16 | Discharge: 2020-04-16 | Disposition: A | Discharge status: Home / Self Care | Payer: 59 | Source: Ambulatory Visit | Attending: Sports Medicine | Admitting: Sports Medicine

## 2020-04-16 ENCOUNTER — Other Ambulatory Visit: Payer: Self-pay

## 2020-04-16 DIAGNOSIS — M47816 Spondylosis without myelopathy or radiculopathy, lumbar region: Secondary | ICD-10-CM

## 2020-04-16 MED ORDER — IOPAMIDOL (ISOVUE-M 200) INJECTION 41%
1.0000 mL | Freq: Once | INTRAMUSCULAR | Status: AC
  Administered 2020-04-16: 1 mL via EPIDURAL

## 2020-04-16 MED ORDER — METHYLPREDNISOLONE ACETATE 40 MG/ML INJ SUSP (RADIOLOG
120.0000 mg | Freq: Once | INTRAMUSCULAR | Status: AC
  Administered 2020-04-16: 120 mg via EPIDURAL

## 2020-04-16 NOTE — Discharge Instructions (Signed)

## 2020-04-18 ENCOUNTER — Telehealth: Payer: Self-pay | Admitting: *Deleted

## 2020-04-18 DIAGNOSIS — M47816 Spondylosis without myelopathy or radiculopathy, lumbar region: Secondary | ICD-10-CM

## 2020-04-18 NOTE — Telephone Encounter (Signed)
Samantha Bird needs the order changed to bilateral L4-L5 facet injection.  She said that Dr. Alfredo Batty did this injection instead of what you ordered.

## 2020-04-18 NOTE — Telephone Encounter (Signed)
No problem, order changed.  In the future they can feel free to change the orders and I am happy to sign off.

## 2020-04-25 ENCOUNTER — Ambulatory Visit (INDEPENDENT_AMBULATORY_CARE_PROVIDER_SITE_OTHER): Payer: 59 | Admitting: Sports Medicine

## 2020-04-25 ENCOUNTER — Other Ambulatory Visit: Payer: Self-pay

## 2020-04-25 DIAGNOSIS — M47816 Spondylosis without myelopathy or radiculopathy, lumbar region: Secondary | ICD-10-CM

## 2020-04-25 NOTE — Assessment & Plan Note (Signed)
This pleasant 49 year old female returns, she has multifactorial low back pain, she did fantastic with bilateral L4-L5 facet joint injections, we do think that the majority of her pain is referred from her facets. She also notes good relief to L4-L5 interlaminar epidurals. She tells me that the only real for her has typically been facet joint injections followed by an epidural. Because of her good response so far we are going to proceed with a longer term solution, medial branch blocks of the bilateral L4-L5 facet joints followed by radiofrequency ablation if effective. Referral back to [facility] for this.

## 2020-04-25 NOTE — Progress Notes (Signed)
    Procedures performed today:    None.  Independent interpretation of notes and tests performed by another provider:   None.  Brief History, Exam, Impression, and Recommendations:    Lumbar facet joint syndrome This pleasant 49 year old female returns, she has multifactorial low back pain, she did fantastic with bilateral L4-L5 facet joint injections, we do think that the majority of her pain is referred from her facets. She also notes good relief to L4-L5 interlaminar epidurals. She tells me that the only real for her has typically been facet joint injections followed by an epidural. Because of her good response so far we are going to proceed with a longer term solution, medial branch blocks of the bilateral L4-L5 facet joints followed by radiofrequency ablation if effective. Referral back to [facility] for this.  I spent 30 minutes of total time managing this patient today, this includes chart review, face to face, and non-face to face time.  ___________________________________________ Ihor Austin. Benjamin Stain, M.D., ABFM., CAQSM. Primary Care and Sports Medicine [facility]  Adjunct Instructor of Family Medicine  [employer]

## 2020-05-04 NOTE — Progress Notes (Signed)
49 y.o. Divorced White female here for colposcopy with possible biopsies and/or ECC due to LGSIL Pap with +HR HPV obtained 03/05/2020.  Pt had abnormal pap smear on 01/07/2019 showing LGSIL wit +HR HPV.  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  She's never had any treatment for abnormal pap smears in the past.  No LMP recorded. (Menstrual status: IUD).          Sexually active: Yes.    The current method of family planning is IUD.     Patient has been counseled about results and procedure.  Risks and benefits have bene reviewed including immediate and/or delayed bleeding, infection, cervical scaring from procedure, possibility of needing additional follow up as well as treatment.  rare risks of missing a lesion discussed as well.  All questions answered.  Pt ready to proceed.  BP 118/74   Pulse 76   Temp (!) 97.3 F (36.3 C) (Skin)   Resp 16   Wt 264 lb (119.7 kg)   BMI 38.71 kg/m   Physical Exam  Constitutional: She is oriented to person, place, and time.  GI: Normal appearance.  Genitourinary:    Vulva, vagina and cervix normal.  There is no rash, tenderness, lesion or injury on the right labia. There is no rash, tenderness, lesion or injury on the left labia.    Genitourinary Comments: IUD string noted   Lymphadenopathy: No inguinal adenopathy noted on the right or left side.  Neurological: She is alert and oriented to person, place, and time.    Speculum placed.  3% acetic acid applied to cervix for >45 seconds.  Cervix visualized with both 7.5X and 15X magnification.  Green filter also used.  Lugols solution was used.  Findings:  No AWE noted with colposcopy and no abnormal staining noted with application of Lugol's solution.  Biopsy:  Not obtained.  ECC: was performed.  Monsel's was not needed.  Excellent hemostasis was present.  Pt tolerated procedure well and all instruments were removed.    Chaperone, Cornelia Copa, CMA, was present during procedure.  Assessment:   LGSIL pap with +HR HPV  Plan:  Pathology results will be called to patient and follow-up planned pending results.

## 2020-05-07 ENCOUNTER — Other Ambulatory Visit: Payer: Self-pay

## 2020-05-07 ENCOUNTER — Ambulatory Visit (INDEPENDENT_AMBULATORY_CARE_PROVIDER_SITE_OTHER): Payer: 59 | Admitting: Obstetrics & Gynecology

## 2020-05-07 ENCOUNTER — Encounter: Payer: Self-pay | Admitting: Obstetrics & Gynecology

## 2020-05-07 ENCOUNTER — Other Ambulatory Visit (HOSPITAL_COMMUNITY)
Admission: RE | Admit: 2020-05-07 | Discharge: 2020-05-07 | Disposition: A | Discharge status: Home / Self Care | Payer: 59 | Source: Ambulatory Visit | Attending: Obstetrics & Gynecology | Admitting: Obstetrics & Gynecology

## 2020-05-07 DIAGNOSIS — B977 Papillomavirus as the cause of diseases classified elsewhere: Secondary | ICD-10-CM | POA: Insufficient documentation

## 2020-05-07 DIAGNOSIS — R87612 Low grade squamous intraepithelial lesion on cytologic smear of cervix (LGSIL): Secondary | ICD-10-CM | POA: Insufficient documentation

## 2020-05-07 NOTE — Patient Instructions (Signed)

## 2020-05-10 DIAGNOSIS — R87612 Low grade squamous intraepithelial lesion on cytologic smear of cervix (LGSIL): Secondary | ICD-10-CM | POA: Insufficient documentation

## 2020-05-10 LAB — SURGICAL PATHOLOGY

## 2020-05-15 ENCOUNTER — Telehealth: Payer: Self-pay

## 2020-05-15 DIAGNOSIS — M47816 Spondylosis without myelopathy or radiculopathy, lumbar region: Secondary | ICD-10-CM | POA: Insufficient documentation

## 2020-05-15 NOTE — Telephone Encounter (Signed)
LVM for Samantha Bird with MRN and epidural information from the note.

## 2020-05-15 NOTE — Telephone Encounter (Signed)
HI Panya.  You just need to call [facility] and give them patient's MRN and read them Dr Melvia Heaps note as he wrote below.   You will call Jenel Lucks at [facility]. She can be reached at [phone]

## 2020-05-15 NOTE — Assessment & Plan Note (Signed)
Has multifactorial back pain related to her disks in her facet, she does well with left L4-L5 interlaminar epidurals occasionally. We are also in the process of getting her set up for facet radiofrequency ablation. Ordering new left L4-L5 interlaminar epidural.

## 2020-05-15 NOTE — Telephone Encounter (Signed)
Pt lvm concerning 2 issues:  1:  Where are we in the process of receiving nerve block pre-cert?  2:  Requesting referral for back shot be sent to [facility]. She now needs a cortisone shot.

## 2020-05-15 NOTE — Telephone Encounter (Signed)
1.  Please make sure [facility] is aware that I have ordered radiofrequency ablation of her facets and the blocks associated with it, further discussion will be with [facility]. 2.  I assume by a cortisone shot she needs an epidural, ordering her left L4-L5 interlaminar epidural, [facility] can also schedule this.

## 2020-05-21 ENCOUNTER — Telehealth: Payer: Self-pay | Admitting: *Deleted

## 2020-05-21 DIAGNOSIS — R87612 Low grade squamous intraepithelial lesion on cytologic smear of cervix (LGSIL): Secondary | ICD-10-CM

## 2020-05-21 DIAGNOSIS — Z8742 Personal history of other diseases of the female genital tract: Secondary | ICD-10-CM

## 2020-05-21 DIAGNOSIS — R87619 Unspecified abnormal cytological findings in specimens from cervix uteri: Secondary | ICD-10-CM

## 2020-05-21 DIAGNOSIS — B977 Papillomavirus as the cause of diseases classified elsewhere: Secondary | ICD-10-CM

## 2020-05-21 NOTE — Telephone Encounter (Signed)
Patient returned call

## 2020-05-21 NOTE — Telephone Encounter (Signed)
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  This was not a definitve finding.  However, it is from the cervical canal where I cannot see and I think it would be wisest to proceed with LEEP for treatment at this time.  She and I discussed this at her colposcopy so this will not be surprising to her.  Thanks.  CC:  Cornelia Copa, CMA

## 2020-05-21 NOTE — Telephone Encounter (Signed)
Leda Min, RN  05/21/2020 12:46 PM EDT Back to Top    Left message to call Noreene Larsson, RN at [facility] [phone].

## 2020-05-21 NOTE — Telephone Encounter (Signed)
Left message to call Chee Dimon, RN at [facility] [phone].   

## 2020-05-22 NOTE — Telephone Encounter (Signed)
Spoke with patient. Advised of all results as seen below per Dr. Hyacinth Meeker. Patient agreeable to proceed with LEEP procedure. Mirena IUD for contraceptive, no menses with IUD. LEEP scheduled for 05/30/20 at 9am with Dr. Hyacinth Meeker. Order placed for precert. Advised to take Motrin 800 mg with food and water one hour before procedure. Patient verbalizes understanding and is agreeable.   Routing to [employer] for [employer].   Encounter closed.

## 2020-05-23 ENCOUNTER — Telehealth: Payer: Self-pay | Admitting: Obstetrics & Gynecology

## 2020-05-23 NOTE — Telephone Encounter (Signed)
Call placed to convey benefits. Spoke with the patient and conveyed the benefits. Patient understands/agreeable with the benefits. Patient is aware of the cancellation policy. Appointment scheduled 05/30/20

## 2020-05-30 ENCOUNTER — Other Ambulatory Visit: Payer: Self-pay

## 2020-05-30 ENCOUNTER — Ambulatory Visit (INDEPENDENT_AMBULATORY_CARE_PROVIDER_SITE_OTHER): Payer: 59 | Admitting: Obstetrics & Gynecology

## 2020-05-30 ENCOUNTER — Ambulatory Visit: Payer: Self-pay

## 2020-05-30 ENCOUNTER — Other Ambulatory Visit (HOSPITAL_COMMUNITY)
Admission: RE | Admit: 2020-05-30 | Discharge: 2020-05-30 | Disposition: A | Discharge status: Home / Self Care | Payer: 59 | Source: Ambulatory Visit | Attending: Obstetrics & Gynecology | Admitting: Obstetrics & Gynecology

## 2020-05-30 ENCOUNTER — Encounter: Payer: Self-pay | Admitting: Obstetrics & Gynecology

## 2020-05-30 DIAGNOSIS — B977 Papillomavirus as the cause of diseases classified elsewhere: Secondary | ICD-10-CM

## 2020-05-30 DIAGNOSIS — R87612 Low grade squamous intraepithelial lesion on cytologic smear of cervix (LGSIL): Secondary | ICD-10-CM | POA: Diagnosis not present

## 2020-05-30 DIAGNOSIS — Z8742 Personal history of other diseases of the female genital tract: Secondary | ICD-10-CM

## 2020-05-30 HISTORY — PX: COLPOSCOPY: SHX161

## 2020-05-30 NOTE — Progress Notes (Signed)
49 y.o. G0P0 Divorced White Female here for LEEP due to dysplasia noted on ECC after having LGSIL pap with +HR HPV obtained on 05/07/2020.      No LMP recorded. (Menstrual status: IUD).          Sexually active: Yes.    The current method of family planning is IUD.     Pre-procedure vitals: Blood pressure 110/60, pulse 80, resp. rate 12, height 5\' 8"  (1.727 m), weight 265 lb (120.2 kg).   Procedure explained and patient's questions were invited and answered.   Consent form signed.  Pre-procedure medication:  Tylenol 1300mg .  Time given:  8am  Procedure Set-up: Grounding pad located left thigh.  Cautery settings: 50 cut/50 coagulation.  Suction applied to coated speculum.  Procedure:  Speculum placed with good visualization of the cervix.  Colposcopy performed showing:  no visible lesions.  Cervix anesthetized using 2% Xylocaine with 1:100,000units Epinephrine.  10 cc's used.  Entire transition zone excised with 10 x 12 loop in 2 passes.  ECC obtained above LEEP specimen.  Specimen(s) placed on cork and labeled for pathology.  Hemostasis obtained with ball cautery and Monsel's solution.  Pt aware IUD strings were cut with LEEP procedure so will not be able to feel or providers see strings in the future.  EBL:  Minimal  Complications:  none  Patient tolerated procedure well and left the office in satisfactory condition.  Plan:  Follow up will be planned pending pathology.

## 2020-05-30 NOTE — Patient Instructions (Signed)

## 2020-06-04 ENCOUNTER — Ambulatory Visit
Admission: RE | Admit: 2020-06-04 | Discharge: 2020-06-04 | Disposition: A | Discharge status: Home / Self Care | Payer: 59 | Source: Ambulatory Visit | Attending: Sports Medicine | Admitting: Sports Medicine

## 2020-06-04 ENCOUNTER — Other Ambulatory Visit: Payer: Self-pay

## 2020-06-04 LAB — SURGICAL PATHOLOGY

## 2020-06-04 MED ORDER — IOPAMIDOL (ISOVUE-M 200) INJECTION 41%
1.0000 mL | Freq: Once | INTRAMUSCULAR | Status: AC
  Administered 2020-06-04: 1 mL via EPIDURAL

## 2020-06-04 MED ORDER — METHYLPREDNISOLONE ACETATE 40 MG/ML INJ SUSP (RADIOLOG
120.0000 mg | Freq: Once | INTRAMUSCULAR | Status: AC
  Administered 2020-06-04: 120 mg via EPIDURAL

## 2020-06-05 ENCOUNTER — Telehealth: Payer: Self-pay | Admitting: *Deleted

## 2020-06-05 NOTE — Telephone Encounter (Signed)
Spoke with patient. Advised of results as seen below per Dr. Hyacinth Meeker. Patient reports she is doing well after LEEP, no concerns. Patient request earlier OV for f/u to further discuss results. Patient asking why we would not go back now and remove additional tissue to remove the abnormal cells if margins still positive?   1 mo f/u rescheduled to 06/26/20 at 4:30pm, cancelled 07/09/20 OV. Patient declines to schedule f/u pap at this time, will schedule after further discussing with Dr. Hyacinth Meeker. Advised I will forward to Dr. Hyacinth Meeker to review and our office will f/u with any additional recommendations. Patient agreeable.   Routing to Dr. Hyacinth Meeker to review.

## 2020-06-05 NOTE — Telephone Encounter (Signed)
Spoke with patient. Patient states she realized in previous conversation that she reported that she was doing well after LEEP, no concerns, but wanted to discuss symptoms she was experiencing.   Patient reports day of LEEP she felt nauseous after the procedure and has been experiencing intermittent nausea and 2 episodes of vomiting since 7/7, "feels pale and green". States she took 1300 mg of Tylenol prior to LEEP on an empty stomach, no other medication since. Reports she also experiences vertigo this time of the year, is unsure if this is the cause.   Reports intermittent spotting since LEEP, noticed an increase in flow today, bleeding is bright red, has not required a pad change.   Denies lightheadedness, weakness, pelvic pain, fever/chills, vaginal odor. Reports bowel movements are normal. Has eaten today, no vomiting.   Advised patient she may have some bright red spotting or blackish d/c for several days after LEEP, this may last 2-3 wks, this should resolve. Advised patient to call office for any pelvic pain, fever, heavy bleeding, foul smelling vag d/c or any additional concerns. Advised if nausea and/or vomiting continues she should see PCP for further evaluation. Advised patient I will update Dr. Hyacinth Meeker and return call if any additional recommendations. Patient thankful for return call.   Routing to Dr. Hyacinth Meeker for review.

## 2020-06-05 NOTE — Telephone Encounter (Signed)
Leda Min, RN  06/05/2020 9:04 AM EDT Back to Top    Left message to call Noreene Larsson, RN at [facility] [phone].    6 mo recall and 12 mo recall placed.

## 2020-06-05 NOTE — Telephone Encounter (Signed)
-----   Message from Jerene Bears, MD sent at 06/05/2020 12:31 AM EDT ----- Please let pt know that her LEEP showed CIN 1.  However the deep margin was positive for CIN 1 as well.  So, this may not have fixed the abnormal pap smears.  Recommend pap in 6 months and then pap/HR HPV 12 months.  Please schedule.  Thanks.  CC:  Cornelia Copa, CMA

## 2020-06-05 NOTE — Telephone Encounter (Signed)
Patient is still bleeding after LEEP and does not feel well.

## 2020-06-22 NOTE — Progress Notes (Signed)
GYNECOLOGY  VISIT  HPI: 49 y.o. G0P0000 Divorced White or Caucasian female here for f/u of LEEP.  Pathology reviewed.  She has many questions about this and wonders about proceeding with hysterectomy.  Having abnormal pap smear and abnormal cells is worrisome for her.  D/w pt reasoning behind monitoring and waiting to see if body will eradicate remaining HPV and then see normalized pap.  Also, possible repeat LEEP instead of hysterectomy if abnormal cells persist also discussed.  Pt understands all of this and is ok with monitoring for now.    She reports she had bleeding for about two weeks post procedure.  This has stopped.  She did initially have a lot of odor.  That is improved but it is still present.  Has not tried anything for this.  Denies urinary symptoms.    GYNECOLOGIC HISTORY: No LMP recorded (lmp unknown). (Menstrual status: IUD). Contraception: Mirena IUD Menopausal hormone therapy: none  Patient Active Problem List   Diagnosis Date Noted  . Lumbar spondylosis 05/15/2020  . LGSIL on Pap smear of cervix 05/10/2020  . History of migraine 11/01/2019  . Hyperlipidemia 11/01/2019  . Obesity (BMI 30-39.9) 11/01/2019  . Lumbar facet joint syndrome 08/16/2019  . Palpitations 10/21/2017  . PVC (premature ventricular contraction) 10/21/2017  . Psoriasis 12/16/2016  . Nonintractable migraine 12/16/2016  . IUD (intrauterine device) in place 12/14/2015    Past Medical History:  Diagnosis Date  . Abnormal Pap smear 2013   neg pap +HPV HR, 16/18 genotype neg, 12/2018 LGSIL HPV HR+  . Condyloma   . IBS (irritable bowel syndrome)   . Migraines    chronic,no aura    Past Surgical History:  Procedure Laterality Date  . AUGMENTATION MAMMAPLASTY Bilateral 2006  . BREAST ENHANCEMENT SURGERY    . COLPOSCOPY    . INTRAUTERINE DEVICE (IUD) INSERTION     inserted in 06,removed, then new one inserted 4/11, 12-14-15 mirena removed & another inserted  . TONSILLECTOMY AND ADENOIDECTOMY       MEDS:   Current Outpatient Medications on File Prior to Visit  Medication Sig Dispense Refill  . Apremilast (OTEZLA) 30 MG TABS Take by mouth.    . Ascorbic Acid (VITAMIN C PO) Take by mouth.    . ASPIRIN 81 PO Take by mouth.    . clobetasol ointment (TEMOVATE) 0.05 %     . cyclobenzaprine (FLEXERIL) 10 MG tablet Take 1 tablet (10 mg total) by mouth at bedtime. 90 tablet 3  . levonorgestrel (MIRENA) 20 MCG/24HR IUD 1 each by Intrauterine route once.    . Multiple Vitamins-Minerals (MULTIVITAMIN PO) Take by mouth as needed.    . naproxen (NAPROSYN) 500 MG tablet Take 1 tablet (500 mg total) by mouth 2 (two) times daily with a meal. 180 tablet 3  . Probiotic Product (PROBIOTIC PO) Take by mouth.    . Pyridoxine HCl (VITAMIN B6 PO) Take by mouth.    . Turmeric (QC TUMERIC COMPLEX PO) Take by mouth.     Marland Kitchen UNABLE TO FIND Apple cidar vinegar gummy    . VITAMIN D PO Take by mouth.     No current facility-administered medications on file prior to visit.    ALLERGIES: Penicillins  Family History  Problem Relation Age of Onset  . Hypertension Brother   . Diabetes Mother   . Heart attack Father     SH:  Divorced, former smoker  Review of Systems  All other systems reviewed and are negative.  PHYSICAL EXAMINATION:    BP 118/70 (BP Location: Left Arm, Patient Position: Sitting, Cuff Size: Large)   Pulse 88   Resp 14   Ht 5\' 8"  (1.727 m)   Wt 268 lb (121.6 kg)   LMP  (LMP Unknown)   BMI 40.75 kg/m     General appearance: alert, cooperative and appears stated age Lymph:  no inguinal LAD noted  Pelvic: External genitalia:  no lesions              Urethra:  normal appearing urethra with no masses, tenderness or lesions              Bartholins and Skenes: normal                 Vagina: normal appearing vagina with normal color and discharge, no lesions              Cervix: no lesions and LEEP site without any blood, healing appropriately              Bimanual Exam:   Uterus:  normal size, contour, position, consistency, mobility, non-tender              Adnexa: no mass, fullness, tenderness               Chaperone, , CMA, was present for exam.  Assessment: H/o CIN 1 with positive margin IUD with strings that were cut during LEEP (cannot see strings today) Vaginal odor  Plan: She is going to have HR HPV/Pap testing 1 year and return for 6 month pap only due to concerns about the positive margins. Affirm pending  28 minutes total spent with pt.

## 2020-06-26 ENCOUNTER — Ambulatory Visit: Payer: 59 | Admitting: Obstetrics & Gynecology

## 2020-06-26 ENCOUNTER — Encounter: Payer: Self-pay | Admitting: Obstetrics & Gynecology

## 2020-06-26 ENCOUNTER — Other Ambulatory Visit: Payer: Self-pay

## 2020-06-26 VITALS — BP 118/70 | HR 88 | Resp 14 | Ht 68.0 in | Wt 268.0 lb

## 2020-06-26 DIAGNOSIS — N870 Mild cervical dysplasia: Secondary | ICD-10-CM | POA: Diagnosis not present

## 2020-06-26 DIAGNOSIS — N898 Other specified noninflammatory disorders of vagina: Secondary | ICD-10-CM | POA: Diagnosis not present

## 2020-06-29 ENCOUNTER — Other Ambulatory Visit: Payer: Self-pay

## 2020-06-29 LAB — VAGINITIS/VAGINOSIS, DNA PROBE
Candida Species: POSITIVE — AB
Gardnerella vaginalis: NEGATIVE
Trichomonas vaginosis: NEGATIVE

## 2020-06-29 MED ORDER — FLUCONAZOLE 150 MG PO TABS: ORAL_TABLET | ORAL | 0 refills | Status: DC

## 2020-06-29 NOTE — Telephone Encounter (Signed)
-----   Message from Jerene Bears, MD sent at 06/29/2020  1:12 PM EDT ----- Please let pt know her vaginitis testing actually showed yeast.  Ok to treat with diflucan 150mg  po x 1, repeat in 72 hours.  #2/0RF Or terazol 7 nightly x 7 nights.

## 2020-06-29 NOTE — Telephone Encounter (Signed)
Patient notified of results. Diflucan sent to pharmacy.

## 2020-07-09 ENCOUNTER — Ambulatory Visit: Payer: 59 | Admitting: Obstetrics & Gynecology

## 2020-11-21 ENCOUNTER — Other Ambulatory Visit: Payer: Self-pay | Admitting: Certified Nurse Midwife

## 2020-11-21 DIAGNOSIS — Z1231 Encounter for screening mammogram for malignant neoplasm of breast: Secondary | ICD-10-CM

## 2020-12-28 ENCOUNTER — Other Ambulatory Visit: Payer: Self-pay

## 2020-12-28 ENCOUNTER — Ambulatory Visit: Payer: 59 | Admitting: Sports Medicine

## 2020-12-28 DIAGNOSIS — M47816 Spondylosis without myelopathy or radiculopathy, lumbar region: Secondary | ICD-10-CM

## 2020-12-28 MED ORDER — CYCLOBENZAPRINE HCL 10 MG PO TABS: 10.0000 mg | ORAL_TABLET | Freq: Every day | ORAL | 3 refills | Status: DC

## 2020-12-28 NOTE — Progress Notes (Signed)
    Procedures performed today:    None.  Independent interpretation of notes and tests performed by another provider:   None.  Brief History, Exam, Impression, and Recommendations:    Lumbar facet joint syndrome Samantha Bird returns, she is a 50 year old female, multifactorial low back pain, historically she has done well with bilateral L4-L5 facet injections, moderate relief with L4-L5 interlaminar epidurals, we had planned an L4-L5 bilateral facet radiofrequency ablation, but this was not followed through with, I am going to reorder the bilateral L4-L5 medial branch blocks followed by facet RFA. Refilling Flexeril.    ___________________________________________ Ihor Austin. Benjamin Stain, M.D., ABFM., CAQSM. Primary Care and Sports Medicine [facility]  Adjunct Instructor of Family Medicine  [employer]

## 2020-12-28 NOTE — Assessment & Plan Note (Signed)
Samantha Bird returns, she is a 50 year old female, multifactorial low back pain, historically she has done well with bilateral L4-L5 facet injections, moderate relief with L4-L5 interlaminar epidurals, we had planned an L4-L5 bilateral facet radiofrequency ablation, but this was not followed through with, I am going to reorder the bilateral L4-L5 medial branch blocks followed by facet RFA. Refilling Flexeril.

## 2020-12-31 ENCOUNTER — Ambulatory Visit
Admission: RE | Admit: 2020-12-31 | Discharge: 2020-12-31 | Disposition: A | Discharge status: Home / Self Care | Payer: 59 | Source: Ambulatory Visit | Attending: Cardiology | Admitting: Cardiology

## 2020-12-31 ENCOUNTER — Other Ambulatory Visit: Payer: Self-pay | Admitting: Sports Medicine

## 2020-12-31 ENCOUNTER — Other Ambulatory Visit: Payer: Self-pay

## 2020-12-31 DIAGNOSIS — M47816 Spondylosis without myelopathy or radiculopathy, lumbar region: Secondary | ICD-10-CM

## 2020-12-31 DIAGNOSIS — Z1231 Encounter for screening mammogram for malignant neoplasm of breast: Secondary | ICD-10-CM

## 2021-01-01 ENCOUNTER — Other Ambulatory Visit: Payer: Self-pay | Admitting: Sports Medicine

## 2021-01-01 DIAGNOSIS — M47816 Spondylosis without myelopathy or radiculopathy, lumbar region: Secondary | ICD-10-CM

## 2021-01-08 ENCOUNTER — Ambulatory Visit
Admission: RE | Admit: 2021-01-08 | Discharge: 2021-01-08 | Disposition: A | Discharge status: Home / Self Care | Payer: 59 | Source: Ambulatory Visit | Attending: Sports Medicine | Admitting: Sports Medicine

## 2021-01-08 ENCOUNTER — Other Ambulatory Visit: Payer: Self-pay

## 2021-01-08 ENCOUNTER — Other Ambulatory Visit: Payer: Self-pay | Admitting: Sports Medicine

## 2021-01-08 DIAGNOSIS — M47816 Spondylosis without myelopathy or radiculopathy, lumbar region: Secondary | ICD-10-CM

## 2021-01-08 NOTE — Discharge Instructions (Signed)

## 2021-01-11 ENCOUNTER — Other Ambulatory Visit: Payer: Self-pay | Admitting: Sports Medicine

## 2021-01-11 ENCOUNTER — Other Ambulatory Visit: Payer: Self-pay

## 2021-01-11 ENCOUNTER — Ambulatory Visit
Admission: RE | Admit: 2021-01-11 | Discharge: 2021-01-11 | Disposition: A | Discharge status: Home / Self Care | Payer: 59 | Source: Ambulatory Visit | Attending: Sports Medicine | Admitting: Sports Medicine

## 2021-01-11 DIAGNOSIS — M47816 Spondylosis without myelopathy or radiculopathy, lumbar region: Secondary | ICD-10-CM

## 2021-01-11 MED ORDER — MIDAZOLAM HCL 2 MG/2ML IJ SOLN
1.0000 mg | INTRAMUSCULAR | Status: DC | PRN
  Administered 2021-01-11: 1 mg via INTRAVENOUS
  Administered 2021-01-11: 0.5 mg via INTRAVENOUS
  Administered 2021-01-11: 1 mg via INTRAVENOUS

## 2021-01-11 MED ORDER — METHYLPREDNISOLONE ACETATE 40 MG/ML INJ SUSP (RADIOLOG
120.0000 mg | Freq: Once | INTRAMUSCULAR | Status: AC
  Administered 2021-01-11: 120 mg via INTRALESIONAL

## 2021-01-11 MED ORDER — SODIUM CHLORIDE 0.9 % IV SOLN: Freq: Once | INTRAVENOUS | Status: AC

## 2021-01-11 MED ORDER — KETOROLAC TROMETHAMINE 30 MG/ML IJ SOLN
30.0000 mg | Freq: Once | INTRAMUSCULAR | Status: AC
  Administered 2021-01-11: 30 mg via INTRAVENOUS

## 2021-01-11 MED ORDER — FENTANYL CITRATE (PF) 100 MCG/2ML IJ SOLN
25.0000 ug | INTRAMUSCULAR | Status: DC | PRN
  Administered 2021-01-11: 50 ug via INTRAVENOUS

## 2021-01-11 NOTE — Progress Notes (Signed)
2549 to nurse's area/recovery via stretcher.  Alert and oriented.  No complaints of pain.  tol gingerale on arrival then looking at phone. Vital signs stable.

## 2021-01-11 NOTE — Discharge Instructions (Signed)
Radio Frequency Ablation Post Procedure Discharge Instructions ° °1. May resume a regular diet and any medications that you routinely take (including pain medications). °2. No driving day of procedure. °3. Upon discharge go home and rest for at least 4 hours.  May use an ice pack as needed to injection sites on back. °4. Remove bandades later, today. ° ° ° °Please contact our office at [phone] for the following symptoms: ° °· Fever greater than 100 degrees °· Increased swelling, pain, or redness at injection site. ° ° °Thank you for visiting [facility]. °

## 2021-02-19 ENCOUNTER — Other Ambulatory Visit: Payer: Self-pay

## 2021-02-19 ENCOUNTER — Other Ambulatory Visit (HOSPITAL_BASED_OUTPATIENT_CLINIC_OR_DEPARTMENT_OTHER): Payer: Self-pay

## 2021-02-19 ENCOUNTER — Ambulatory Visit (INDEPENDENT_AMBULATORY_CARE_PROVIDER_SITE_OTHER): Payer: 59 | Admitting: Obstetrics & Gynecology

## 2021-02-19 ENCOUNTER — Ambulatory Visit (HOSPITAL_BASED_OUTPATIENT_CLINIC_OR_DEPARTMENT_OTHER): Payer: 59 | Admitting: Obstetrics & Gynecology

## 2021-02-19 ENCOUNTER — Other Ambulatory Visit (HOSPITAL_COMMUNITY)
Admission: RE | Admit: 2021-02-19 | Discharge: 2021-02-19 | Disposition: A | Discharge status: Home / Self Care | Payer: 59 | Source: Ambulatory Visit | Attending: Obstetrics & Gynecology | Admitting: Obstetrics & Gynecology

## 2021-02-19 ENCOUNTER — Other Ambulatory Visit (HOSPITAL_BASED_OUTPATIENT_CLINIC_OR_DEPARTMENT_OTHER)
Admission: RE | Admit: 2021-02-19 | Discharge: 2021-02-19 | Disposition: A | Discharge status: Home / Self Care | Payer: 59 | Source: Ambulatory Visit | Attending: Obstetrics & Gynecology | Admitting: Obstetrics & Gynecology

## 2021-02-19 ENCOUNTER — Encounter (HOSPITAL_BASED_OUTPATIENT_CLINIC_OR_DEPARTMENT_OTHER): Payer: Self-pay | Admitting: Obstetrics & Gynecology

## 2021-02-19 VITALS — Ht 67.37 in | Wt 272.0 lb

## 2021-02-19 DIAGNOSIS — R87622 Low grade squamous intraepithelial lesion on cytologic smear of vagina (LGSIL): Secondary | ICD-10-CM

## 2021-02-19 DIAGNOSIS — R232 Flushing: Secondary | ICD-10-CM

## 2021-02-19 DIAGNOSIS — N930 Postcoital and contact bleeding: Secondary | ICD-10-CM

## 2021-02-19 DIAGNOSIS — M47816 Spondylosis without myelopathy or radiculopathy, lumbar region: Secondary | ICD-10-CM

## 2021-02-19 NOTE — Progress Notes (Signed)
GYNECOLOGY  VISIT  CC:   Postcoital bleeding  HPI: 50 y.o. G0P0000 Married White or Caucasian female here for discussion of post coital bleeding that has started wtihin the last few weeks.  She is having hot flashes.  She is having some having hot flashes with drinking alcohol as well.  She is wondering if this is menopause.  Does feel like she can sweat very easily as well.  Is not really interested in HRT.  She does need a repeat pap smear due to hx of LEEP 05/2020 with LGSIL.  Had IUD that was placed 12/14/2015.  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    Patient Active Problem List   Diagnosis Date Noted  . Lumbar spondylosis 05/15/2020  . LGSIL on Pap smear of cervix 05/10/2020  . History of migraine 11/01/2019  . Hyperlipidemia 11/01/2019  . Obesity (BMI 30-39.9) 11/01/2019  . Lumbar facet joint syndrome 08/16/2019  . Palpitations 10/21/2017  . PVC (premature ventricular contraction) 10/21/2017  . Psoriasis 12/16/2016  . Nonintractable migraine 12/16/2016  . IUD (intrauterine device) in place 12/14/2015    Past Medical History:  Diagnosis Date  . Abnormal Pap smear 2013   neg pap +HPV HR, 16/18 genotype neg, 12/2018 LGSIL HPV HR+  . Condyloma   . IBS (irritable bowel syndrome)   . Migraines    chronic,no aura    Past Surgical History:  Procedure Laterality Date  . AUGMENTATION MAMMAPLASTY Bilateral 2006  . BREAST ENHANCEMENT SURGERY    . COLPOSCOPY    . INTRAUTERINE DEVICE (IUD) INSERTION     inserted in 06,removed, then new one inserted 4/11, 12-14-15 mirena removed & another inserted  . TONSILLECTOMY AND ADENOIDECTOMY      MEDS:   Current Outpatient Medications on File Prior to Visit  Medication Sig Dispense Refill  . Apremilast 30 MG TABS Take by mouth.    . Ascorbic Acid (VITAMIN C PO) Take by mouth.    . clobetasol ointment (TEMOVATE) 0.05 %     . cyclobenzaprine (FLEXERIL) 10 MG tablet Take 1 tablet (10 mg total) by mouth at bedtime. 90 tablet 3  . levonorgestrel (MIRENA) 20  MCG/24HR IUD 1 each by Intrauterine route once.    . Multiple Vitamins-Minerals (MULTIVITAMIN PO) Take by mouth as needed.    Marland Kitchen NAPROXEN PO Take 300 mg by mouth.    . Probiotic Product (PROBIOTIC PO) Take by mouth.     No current facility-administered medications on file prior to visit.    ALLERGIES: Penicillins  Family History  Problem Relation Age of Onset  . Hypertension Brother   . Diabetes Mother   . Heart attack Father     SH:  Married, non smoker  Review of Systems  Constitutional:       Hot flashes  Genitourinary: Positive for vaginal bleeding.    PHYSICAL EXAMINATION:    Ht 5' 7.37" (1.711 m)   Wt 272 lb (123.4 kg)   BMI 42.13 kg/m     General appearance: alert, cooperative and appears stated age Lymph:  no inguinal LAD noted  Pelvic: External genitalia:  no lesions              Urethra:  normal appearing urethra with no masses, tenderness or lesions              Bartholins and Skenes: normal                 Vagina: normal appearing vagina with normal color and discharge, no lesions  Cervix: no lesions and no IUD string noted              Bimanual Exam:  Uterus:  normal size, contour, position, consistency, mobility, non-tender              Adnexa: no mass, fullness, tenderness  Chaperone, Margret Chance,, CMA, was present for exam.  Assessment/Plan: 1. LGSIL Pap smear of vagina, s/p LEEP 05/2020 - Cytology - PAP( [facility])  2. Hot flashes - Estradiol; Future - Follicle stimulating hormone; Future - pt does not desire HRT at this time but may consider other options  3.  Postcoital bleeding - feel this may be related to hormonal changes.  If pap ok and FSH elevated will treat with vaginal estrogen.

## 2021-02-21 LAB — ESTRADIOL: Estradiol: <5 pg/mL

## 2021-02-21 LAB — FOLLICLE STIMULATING HORMONE: FSH: 35.7 m[IU]/mL

## 2021-02-21 LAB — CYTOLOGY - PAP
Comment: NEGATIVE
Diagnosis: UNDETERMINED — AB
High risk HPV: NEGATIVE

## 2021-02-22 ENCOUNTER — Other Ambulatory Visit (HOSPITAL_BASED_OUTPATIENT_CLINIC_OR_DEPARTMENT_OTHER): Payer: Self-pay

## 2021-02-22 MED ORDER — ESTRADIOL 0.1 MG/GM VA CREA: TOPICAL_CREAM | VAGINAL | 6 refills | Status: DC

## 2021-03-25 ENCOUNTER — Ambulatory Visit: Payer: 59

## 2021-04-04 DIAGNOSIS — I493 Ventricular premature depolarization: Secondary | ICD-10-CM

## 2021-04-04 HISTORY — DX: Ventricular premature depolarization: I49.3

## 2021-06-12 ENCOUNTER — Other Ambulatory Visit: Payer: Self-pay | Admitting: Sports Medicine

## 2021-06-12 DIAGNOSIS — M47816 Spondylosis without myelopathy or radiculopathy, lumbar region: Secondary | ICD-10-CM

## 2021-06-12 MED ORDER — NAPROXEN 375 MG PO TBEC: 1.0000 | DELAYED_RELEASE_TABLET | Freq: Two times a day (BID) | ORAL | 3 refills | Status: DC

## 2021-08-07 ENCOUNTER — Ambulatory Visit: Payer: 59 | Admitting: Sports Medicine

## 2021-08-09 ENCOUNTER — Ambulatory Visit: Payer: 59 | Admitting: Sports Medicine

## 2021-08-13 ENCOUNTER — Other Ambulatory Visit: Payer: Self-pay

## 2021-08-13 ENCOUNTER — Ambulatory Visit (INDEPENDENT_AMBULATORY_CARE_PROVIDER_SITE_OTHER): Payer: No Typology Code available for payment source | Admitting: Sports Medicine

## 2021-08-13 DIAGNOSIS — M7711 Lateral epicondylitis, right elbow: Secondary | ICD-10-CM | POA: Diagnosis not present

## 2021-08-13 MED ORDER — NAPROXEN 500 MG PO TABS
500.0000 mg | ORAL_TABLET | Freq: Two times a day (BID) | ORAL | 3 refills | Status: DC
Start: 2021-08-13 — End: 2022-05-13

## 2021-08-13 NOTE — Progress Notes (Signed)
    Procedures performed today:    None.  Independent interpretation of notes and tests performed by another provider:   None.  Brief History, Exam, Impression, and Recommendations:    Lateral epicondylitis, right elbow This is a pleasant 50 year old female, she has had about a month of pain in the lateral elbow on the right, worse with gripping motions. On exam she has tenderness at the common extensor tendon origin, reproduction of pain with resisted extension of the middle finger. Pain is relatively mild so we will do tennis elbow brace, increasing her naproxen dose back to 500 mg and adding home rehab exercises, return to see me in a month, injection if no better.    ___________________________________________ Ihor Austin. Benjamin Stain, M.D., ABFM., CAQSM. Primary Care and Sports Medicine [facility]  Adjunct Instructor of Family Medicine  [employer]

## 2021-08-13 NOTE — Assessment & Plan Note (Signed)
This is a pleasant 50 year old female, she has had about a month of pain in the lateral elbow on the right, worse with gripping motions. On exam she has tenderness at the common extensor tendon origin, reproduction of pain with resisted extension of the middle finger. Pain is relatively mild so we will do tennis elbow brace, increasing her naproxen dose back to 500 mg and adding home rehab exercises, return to see me in a month, injection if no better.

## 2021-08-30 ENCOUNTER — Ambulatory Visit (HOSPITAL_BASED_OUTPATIENT_CLINIC_OR_DEPARTMENT_OTHER): Payer: 59 | Admitting: Obstetrics & Gynecology

## 2021-09-10 ENCOUNTER — Ambulatory Visit: Payer: No Typology Code available for payment source | Admitting: Sports Medicine

## 2021-09-11 ENCOUNTER — Ambulatory Visit: Payer: No Typology Code available for payment source | Admitting: Sports Medicine

## 2021-09-11 ENCOUNTER — Ambulatory Visit (INDEPENDENT_AMBULATORY_CARE_PROVIDER_SITE_OTHER): Payer: No Typology Code available for payment source

## 2021-09-11 ENCOUNTER — Other Ambulatory Visit: Payer: Self-pay

## 2021-09-11 DIAGNOSIS — M7711 Lateral epicondylitis, right elbow: Secondary | ICD-10-CM

## 2021-09-11 NOTE — Assessment & Plan Note (Addendum)
This is a pleasant 50 year old female, lateral epicondylitis, persistent pain in spite of increased dose of naproxen and conditioning exercises, common extensor tendon origin injection today. Return to see me in 4 weeks.

## 2021-09-11 NOTE — Progress Notes (Signed)
    Procedures performed today:    Procedure: Real-time Ultrasound Guided injection of the right common extensor tendon origin Device: Samsung [id number]  Verbal informed consent obtained.  Time-out conducted.  Noted no overlying erythema, induration, or other signs of local infection.  Skin prepped in a sterile fashion.  Local anesthesia: Topical Ethyl chloride.  With sterile technique and under real time ultrasound guidance: Noted small gap in common extensor tendon origin, 1 cc Kenalog 40, 1 cc lidocaine, 1 cc bupivacaine injected easily Completed without difficulty  Advised to call if fevers/chills, erythema, induration, drainage, or persistent bleeding.  Images permanently stored and available for review in PACS.  Impression: Technically successful ultrasound guided injection.  Independent interpretation of notes and tests performed by another provider:   None.  Brief History, Exam, Impression, and Recommendations:    Lateral epicondylitis, right elbow This is a pleasant 50 year old female, lateral epicondylitis, persistent pain in spite of increased dose of naproxen and conditioning exercises, common extensor tendon origin injection today. Return to see me in 4 weeks.    ___________________________________________ Ihor Austin. Benjamin Stain, M.D., ABFM., CAQSM. Primary Care and Sports Medicine [facility]  Adjunct Instructor of Family Medicine  [employer]

## 2021-09-23 ENCOUNTER — Other Ambulatory Visit: Payer: Self-pay

## 2021-09-23 ENCOUNTER — Encounter (HOSPITAL_BASED_OUTPATIENT_CLINIC_OR_DEPARTMENT_OTHER): Payer: Self-pay | Admitting: Obstetrics & Gynecology

## 2021-09-23 ENCOUNTER — Other Ambulatory Visit (HOSPITAL_COMMUNITY)
Admission: RE | Admit: 2021-09-23 | Discharge: 2021-09-23 | Disposition: A | Discharge status: Home / Self Care | Payer: No Typology Code available for payment source | Source: Ambulatory Visit | Attending: Obstetrics & Gynecology | Admitting: Obstetrics & Gynecology

## 2021-09-23 ENCOUNTER — Ambulatory Visit (INDEPENDENT_AMBULATORY_CARE_PROVIDER_SITE_OTHER): Payer: No Typology Code available for payment source | Admitting: Obstetrics & Gynecology

## 2021-09-23 VITALS — BP 127/64 | HR 84 | Ht 68.0 in | Wt 254.1 lb

## 2021-09-23 DIAGNOSIS — Z9889 Other specified postprocedural states: Secondary | ICD-10-CM

## 2021-09-23 DIAGNOSIS — Z124 Encounter for screening for malignant neoplasm of cervix: Secondary | ICD-10-CM | POA: Diagnosis not present

## 2021-09-23 DIAGNOSIS — Z8619 Personal history of other infectious and parasitic diseases: Secondary | ICD-10-CM

## 2021-09-23 DIAGNOSIS — Z01419 Encounter for gynecological examination (general) (routine) without abnormal findings: Secondary | ICD-10-CM

## 2021-09-23 DIAGNOSIS — R87622 Low grade squamous intraepithelial lesion on cytologic smear of vagina (LGSIL): Secondary | ICD-10-CM | POA: Diagnosis not present

## 2021-09-23 NOTE — Progress Notes (Signed)
50 y.o. G0P0000 Married White or Caucasian female here for annual exam.  Denies vaginal bleeding.  Hot flashes have gotten much better.    H/o Mirena IUD placed 12/14/2015.  8 year indication discussed.    No LMP recorded. (Menstrual status: IUD).          Sexually active: Yes.    The current method of family planning is IUD.    Exercising: Yes.     pilates Smoker:  former smoker  Health Maintenance: Pap:  02/19/2021 ASCUS History of abnormal Pap:  yes, h/o LEEP 2021 MMG:  12/31/2020 Negative Colonoscopy:  07/20/2019, follow up 10 years Screening Labs: done in early October   reports that she has quit smoking. She has never used smokeless tobacco. She reports current alcohol use. She reports that she does not use drugs.  Past Medical History:  Diagnosis Date   Abnormal Pap smear 2013   neg pap +HPV HR, 16/18 genotype neg, 12/2018 LGSIL HPV HR+   Condyloma    IBS (irritable bowel syndrome)    Migraines    chronic,no aura    Past Surgical History:  Procedure Laterality Date   AUGMENTATION MAMMAPLASTY Bilateral 2006   BREAST ENHANCEMENT SURGERY     COLPOSCOPY     INTRAUTERINE DEVICE (IUD) INSERTION     inserted in 06,removed, then new one inserted 4/11, 12-14-15 mirena removed & another inserted   TONSILLECTOMY AND ADENOIDECTOMY      Current Outpatient Medications  Medication Sig Dispense Refill   Apremilast 30 MG TABS Take by mouth.     Ascorbic Acid (VITAMIN C PO) Take by mouth.     clobetasol ointment (TEMOVATE) 0.05 %      cyclobenzaprine (FLEXERIL) 10 MG tablet Take 1 tablet (10 mg total) by mouth at bedtime. 90 tablet 3   levonorgestrel (MIRENA) 20 MCG/24HR IUD 1 each by Intrauterine route once.     Multiple Vitamins-Minerals (MULTIVITAMIN PO) Take by mouth as needed.     naproxen (NAPROSYN) 500 MG tablet Take 1 tablet (500 mg total) by mouth 2 (two) times daily with a meal. 60 tablet 3   Probiotic Product (PROBIOTIC PO) Take by mouth.     estradiol (ESTRACE) 0.1  MG/GM vaginal cream Apply 1 gram per vagina twice weekly after intercourse (Patient not taking: Reported on 09/23/2021) 30 g 6   No current facility-administered medications for this visit.    Family History  Problem Relation Age of Onset   Hypertension Brother    Diabetes Mother    Heart attack Father     Review of Systems  All other systems reviewed and are negative.  Exam:   BP 127/64 (BP Location: Right Arm, Patient Position: Sitting, Cuff Size: Large)   Pulse 84   Ht 5\' 8"  (1.727 m) Comment: reoported  Wt 254 lb 2.2 oz (115.3 kg)   BMI 38.64 kg/m   Height: 5\' 8"  (172.7 cm) (reoported)  General appearance: alert, cooperative and appears stated age Head: Normocephalic, without obvious abnormality, atraumatic Neck: no adenopathy, supple, symmetrical, trachea midline and thyroid normal to inspection and palpation Lungs: clear to auscultation bilaterally Breasts: normal appearance, no masses or tenderness Heart: regular rate and rhythm Abdomen: soft, non-tender; bowel sounds normal; no masses,  no organomegaly Extremities: extremities normal, atraumatic, no cyanosis or edema Skin: Skin color, texture, turgor normal. No rashes or lesions Lymph nodes: Cervical, supraclavicular, and axillary nodes normal. No abnormal inguinal nodes palpated Neurologic: Grossly normal   Pelvic: External genitalia:  no lesions  Urethra:  normal appearing urethra with no masses, tenderness or lesions              Bartholins and Skenes: normal                 Vagina: normal appearing vagina with normal color and no discharge, no lesions              Cervix: no lesions              Pap taken: Yes.   Bimanual Exam:  Uterus:  normal size, contour, position, consistency, mobility, non-tender              Adnexa: normal adnexa and no mass, fullness, tenderness               Rectovaginal: Confirms               Anus:  normal sphincter tone, no lesions  Chaperone, Ina Homes, CMA, was  present for exam.  Assessment/Plan: 1. Well woman exam with routine gynecological exam - pap and HR HPV obtained today - MMG 12/31/2020 - colonoscopy 2020, follow up 10 years - BMD not indicated - lab work with PCP - care gaps reviewed/updated  2. LGSIL Pap smear of vagina  3. History of loop electrical excision procedure (LEEP) - 2021  4. History of condyloma acuminatum

## 2021-09-26 LAB — CYTOLOGY - PAP
Comment: NEGATIVE
Comment: NEGATIVE
Diagnosis: UNDETERMINED — AB
HPV 16: NEGATIVE
HPV 18 / 45: NEGATIVE
High risk HPV: POSITIVE — AB

## 2021-09-27 ENCOUNTER — Encounter (HOSPITAL_BASED_OUTPATIENT_CLINIC_OR_DEPARTMENT_OTHER): Payer: Self-pay

## 2021-10-09 ENCOUNTER — Ambulatory Visit: Payer: No Typology Code available for payment source | Admitting: Sports Medicine

## 2021-10-23 ENCOUNTER — Other Ambulatory Visit (HOSPITAL_COMMUNITY)
Admission: RE | Admit: 2021-10-23 | Discharge: 2021-10-23 | Disposition: A | Discharge status: Home / Self Care | Payer: No Typology Code available for payment source | Source: Ambulatory Visit | Attending: Obstetrics & Gynecology | Admitting: Obstetrics & Gynecology

## 2021-10-23 ENCOUNTER — Ambulatory Visit (HOSPITAL_BASED_OUTPATIENT_CLINIC_OR_DEPARTMENT_OTHER): Payer: No Typology Code available for payment source | Admitting: Obstetrics & Gynecology

## 2021-10-23 ENCOUNTER — Other Ambulatory Visit: Payer: Self-pay

## 2021-10-23 DIAGNOSIS — R87610 Atypical squamous cells of undetermined significance on cytologic smear of cervix (ASC-US): Secondary | ICD-10-CM | POA: Diagnosis not present

## 2021-10-23 DIAGNOSIS — A5131 Condyloma latum: Secondary | ICD-10-CM | POA: Diagnosis not present

## 2021-10-23 DIAGNOSIS — R87810 Cervical high risk human papillomavirus (HPV) DNA test positive: Secondary | ICD-10-CM | POA: Insufficient documentation

## 2021-10-25 LAB — SURGICAL PATHOLOGY

## 2021-10-26 NOTE — Progress Notes (Signed)
50 y.o. G0P0000 Married Not Hispanic or Latino female here for colposcopy with possible biopsies and/or ECC due to ASCUS pap with +HR HPV obtained at [facility] on 09/23/2021.  Pt has hx of LEEP 05/2020 with CIN 1 findings.  No LMP recorded. (Menstrual status: IUD).          Sexually active: Yes.    The current method of family planning is IUD.     Patient has been counseled about results and procedure.  Risks and benefits have bene reviewed including immediate and/or delayed bleeding, infection, cervical scaring from procedure, possibility of needing additional follow up as well as treatment.  Rare risks of missing a lesion discussed as well.  All questions answered.  Pt ready to proceed.  Consent obtained.  There were no vitals taken for this visit.  General appearance: alert, cooperative and appears stated age Lymph nodes: No abnormal inguinal nodes palpated Neurologic: Grossly normal  Pelvic: External genitalia:  no lesions              Urethra:  normal appearing urethra with no masses, tenderness or lesions              Bartholins and Skenes: normal                 Vagina: normal appearing vagina with normal color and no discharge, no lesions               Physical Exam Constitutional:      Appearance: Normal appearance.  Genitourinary:    General: Normal vulva.     Vagina: Normal.     Cervix: Normal.  Neurological:     Mental Status: She is alert.    Speculum placed.  3% acetic acid applied to cervix for >45 seconds.  Cervix visualized with both 7.5X and 15X magnification.  Green filter also used.  Lugols solution was used.  Findings:  no AWE noted and not abnormal staining with Lugols solution.  Biopsy:  not obtained.  ECC:  was performed.  Monsel's was not needed.  Excellent hemostasis was present.  Pt tolerated procedure well and all instruments were removed.    Chaperone, Ina Homes, was present during procedure.  Assessment/Plan: 1. ASCUS with positive high risk HPV cervical -  Surgical pathology( [place]/ [facility]) - Pathology results will be called to patient and follow-up planned pending results. - pt is ready to proceed with more definitive treatment for her persistent abnormal pap smears.  Feels like she is just waiting for something bad to happen.  Ready for hysterectomy.  Feel this is reasonable as we have been following this for more than 2 years.  She does not want to do this until into the summer of 2023. - will need to precert after January 1st.

## 2021-11-24 DIAGNOSIS — R400 Somnolence: Secondary | ICD-10-CM

## 2021-11-24 DIAGNOSIS — R0609 Other forms of dyspnea: Secondary | ICD-10-CM

## 2021-11-24 HISTORY — DX: Somnolence: R40.0

## 2021-11-24 HISTORY — DX: Other forms of dyspnea: R06.09

## 2022-01-02 ENCOUNTER — Encounter (HOSPITAL_BASED_OUTPATIENT_CLINIC_OR_DEPARTMENT_OTHER): Payer: Self-pay | Admitting: Obstetrics & Gynecology

## 2022-01-02 ENCOUNTER — Other Ambulatory Visit: Payer: Self-pay | Admitting: Physical Medicine and Rehabilitation

## 2022-01-02 ENCOUNTER — Other Ambulatory Visit: Payer: Self-pay | Admitting: Obstetrics & Gynecology

## 2022-01-02 DIAGNOSIS — Z1231 Encounter for screening mammogram for malignant neoplasm of breast: Secondary | ICD-10-CM

## 2022-01-06 ENCOUNTER — Ambulatory Visit: Payer: No Typology Code available for payment source

## 2022-01-07 ENCOUNTER — Ambulatory Visit: Payer: No Typology Code available for payment source

## 2022-01-10 ENCOUNTER — Ambulatory Visit
Admission: RE | Admit: 2022-01-10 | Discharge: 2022-01-10 | Disposition: A | Discharge status: Home / Self Care | Payer: PRIVATE HEALTH INSURANCE | Source: Ambulatory Visit | Attending: Obstetrics & Gynecology | Admitting: Obstetrics & Gynecology

## 2022-01-10 DIAGNOSIS — Z1231 Encounter for screening mammogram for malignant neoplasm of breast: Secondary | ICD-10-CM

## 2022-01-27 ENCOUNTER — Encounter (HOSPITAL_BASED_OUTPATIENT_CLINIC_OR_DEPARTMENT_OTHER): Payer: Self-pay

## 2022-02-03 ENCOUNTER — Other Ambulatory Visit: Payer: Self-pay | Admitting: Sports Medicine

## 2022-02-03 DIAGNOSIS — M47816 Spondylosis without myelopathy or radiculopathy, lumbar region: Secondary | ICD-10-CM

## 2022-02-07 ENCOUNTER — Ambulatory Visit (INDEPENDENT_AMBULATORY_CARE_PROVIDER_SITE_OTHER): Payer: PRIVATE HEALTH INSURANCE

## 2022-02-07 ENCOUNTER — Ambulatory Visit: Payer: PRIVATE HEALTH INSURANCE | Admitting: Sports Medicine

## 2022-02-07 ENCOUNTER — Other Ambulatory Visit: Payer: Self-pay

## 2022-02-07 DIAGNOSIS — M47816 Spondylosis without myelopathy or radiculopathy, lumbar region: Secondary | ICD-10-CM | POA: Diagnosis not present

## 2022-02-07 DIAGNOSIS — M7711 Lateral epicondylitis, right elbow: Secondary | ICD-10-CM

## 2022-02-07 MED ORDER — CYCLOBENZAPRINE HCL 10 MG PO TABS: 10.0000 mg | ORAL_TABLET | Freq: Every day | ORAL | 3 refills | Status: DC

## 2022-02-07 NOTE — Assessment & Plan Note (Addendum)
Pleasant 51 year old female, history of lateral epicondylitis last injected approximately 5 months ago. ?Recurrence of pain with Pilates. ?Repeat injection today, I would like her to wear her tennis elbow brace and she can return to see me as needed for this. ?If she does have a recurrence I would suggest we do PRP percutaneous tenotomy. ?

## 2022-02-07 NOTE — Progress Notes (Addendum)
? ? ?  Procedures performed today:   ? ?Procedure: Real-time Ultrasound Guided injection of the right common extensor tendon origin ?Device: Samsung HS60  ?Verbal informed consent obtained.  ?Time-out conducted.  ?Noted no overlying erythema, induration, or other signs of local infection.  ?Skin prepped in a sterile fashion.  ?Local anesthesia: Topical Ethyl chloride.  ?With sterile technique and under real time ultrasound guidance: Noted small gap in the common extensor tendon, 1 cc lidocaine, 1 cc bupivacaine, 1 cc kenalog 40 injected easily. ?Completed without difficulty  ?Advised to call if fevers/chills, erythema, induration, drainage, or persistent bleeding.  ?Images permanently stored and available for review in PACS.  ?Impression: Technically successful ultrasound guided injection. ? ?Independent interpretation of notes and tests performed by another provider:  ? ?None. ? ?Brief History, Exam, Impression, and Recommendations:   ? ?Lateral epicondylitis, right elbow ?Pleasant 51 year old female, history of lateral epicondylitis last injected approximately 5 months ago. ?Recurrence of pain with Pilates. ?Repeat injection today, I would like her to wear her tennis elbow brace and she can return to see me as needed for this. ?If she does have a recurrence I would suggest we do PRP percutaneous tenotomy. ? ?Lumbar facet joint syndrome ?Mayar is now post facet radiofrequency ablation, bilateral L4-L5, she is doing extremely well, very happy with her results. ? ? ? ?___________________________________________ ?Ihor Austin. Benjamin Stain, M.D., ABFM., CAQSM. ?Primary Care and Sports Medicine ?[facility] ? ?Adjunct Instructor of Family Medicine  ?[employer] ?

## 2022-02-07 NOTE — Assessment & Plan Note (Signed)
Samantha Bird is now post facet radiofrequency ablation, bilateral L4-L5, she is doing extremely well, very happy with her results. ?

## 2022-02-19 ENCOUNTER — Other Ambulatory Visit (HOSPITAL_BASED_OUTPATIENT_CLINIC_OR_DEPARTMENT_OTHER): Payer: Self-pay | Admitting: Obstetrics & Gynecology

## 2022-02-19 DIAGNOSIS — Z01818 Encounter for other preprocedural examination: Secondary | ICD-10-CM

## 2022-02-19 DIAGNOSIS — N879 Dysplasia of cervix uteri, unspecified: Secondary | ICD-10-CM

## 2022-04-28 ENCOUNTER — Encounter (HOSPITAL_BASED_OUTPATIENT_CLINIC_OR_DEPARTMENT_OTHER): Payer: Self-pay | Admitting: Obstetrics & Gynecology

## 2022-05-05 ENCOUNTER — Other Ambulatory Visit: Payer: Self-pay

## 2022-05-05 ENCOUNTER — Encounter (HOSPITAL_BASED_OUTPATIENT_CLINIC_OR_DEPARTMENT_OTHER): Payer: PRIVATE HEALTH INSURANCE | Admitting: Obstetrics & Gynecology

## 2022-05-05 ENCOUNTER — Encounter (HOSPITAL_BASED_OUTPATIENT_CLINIC_OR_DEPARTMENT_OTHER): Payer: Self-pay | Admitting: Obstetrics & Gynecology

## 2022-05-05 NOTE — Progress Notes (Addendum)
Your procedure is scheduled on Tuesday, 05/13/2022.  Report to [facility] AT  5:30 A. M.   Call this number if you have problems the morning of surgery  :[phone].   OUR ADDRESS IS [address].  [facility] LOCATED IN [facility].  PLEASE BRING YOUR INSURANCE CARD AND PHOTO ID DAY OF SURGERY.  ONLY 2 PEOPLE ARE ALLOWED IN  WAITING  ROOM.                                      REMEMBER:  DO NOT EAT FOOD, CANDY GUM OR MINTS  AFTER MIDNIGHT THE NIGHT BEFORE YOUR SURGERY . YOU MAY HAVE CLEAR LIQUIDS FROM MIDNIGHT THE NIGHT BEFORE YOUR SURGERY UNTIL  4:30 AM. NO CLEAR LIQUIDS AFTER   4:30 AM DAY OF SURGERY.  YOU MAY  BRUSH YOUR TEETH MORNING OF SURGERY AND RINSE YOUR MOUTH OUT, NO CHEWING GUM CANDY OR MINTS.     CLEAR LIQUID DIET   Foods Allowed                                                                     Foods Excluded  Coffee and tea, regular and decaf                             liquids that you cannot  Plain Jell-O                                                                   see through such as: Fruit ices (not with fruit pulp)                                     milk, soups, orange juice  Plain  Popsicles                                    All solid food Carbonated beverages, regular and diet                                    Cranberry, grape and apple juices Sports drinks like Gatorade _____________________________________________________________________     TAKE THESE MEDICATIONS MORNING OF SURGERY:  Flonase if needed, Metoprolol, Aprimelast   UP TO 4 VISITORS  MAY VISIT IN THE EXTENDED RECOVERY ROOM UNTIL 800 PM ONLY.  ONE  VISITOR AGE 27 AND OVER MAY SPEND THE NIGHT AND MUST BE IN EXTENDED RECOVERY ROOM NO LATER THAN 800 PM . YOUR DISCHARGE TIME AFTER YOU SPEND THE NIGHT IS 900 AM THE MORNING AFTER YOUR SURGERY.  YOU MAY PACK A SMALL OVERNIGHT BAG WITH TOILETRIES FOR  YOUR OVERNIGHT STAY IF YOU WISH.  YOUR  PRESCRIPTION MEDICATIONS WILL BE PROVIDED DURING [place].                                      DO NOT WEAR JEWERLY, MAKE UP. DO NOT WEAR LOTIONS, POWDERS, PERFUMES OR NAIL POLISH ON YOUR FINGERNAILS. TOENAIL POLISH IS OK TO WEAR. DO NOT SHAVE FOR 48 HOURS PRIOR TO DAY OF SURGERY. MEN MAY SHAVE FACE AND NECK. CONTACTS, GLASSES, OR DENTURES MAY NOT BE WORN TO SURGERY.  REMEMBER: NO SMOKING, DRUGS OR ALCOHOL FOR 24 HOURS BEFORE YOUR SURGERY.                                    [facility] IS NOT RESPONSIBLE  FOR ANY BELONGINGS.                                                                    Marland Kitchen            - Preparing for Surgery Before surgery, you can play an important role.  Because skin is not sterile, your skin needs to be as free of germs as possible.  You can reduce the number of germs on your skin by washing with CHG (chlorahexidine gluconate) soap before surgery.  CHG is an antiseptic cleaner which kills germs and bonds with the skin to continue killing germs even after washing. Please DO NOT use if you have an allergy to CHG or antibacterial soaps.  If your skin becomes reddened/irritated stop using the CHG and inform your nurse when you arrive at Short Stay. Do not shave (including legs and underarms) for at least 48 hours prior to the first CHG shower.  You may shave your face/neck. Please follow these instructions carefully:  1.  Shower with CHG Soap the night before surgery and the  morning of Surgery.  2.  If you choose to wash your hair, wash your hair first as usual with your  normal  shampoo.  3.  After you shampoo, rinse your hair and body thoroughly to remove the  shampoo.                            4.  Use CHG as you would any other liquid soap.  You can apply chg directly  to the skin and wash , please wash your belly button thoroughly with chg soap provided night before and morning of your surgery.                     Gently with a scrungie or clean  washcloth.  5.  Apply the CHG Soap to your body ONLY FROM THE NECK DOWN.   Do not use on face/ open                           Wound or open sores. Avoid contact with eyes, ears mouth and genitals (private parts).  Wash face,  Genitals (private parts) with your normal soap.             6.  Wash thoroughly, paying special attention to the area where your surgery  will be performed.  7.  Thoroughly rinse your body with warm water from the neck down.  8.  DO NOT shower/wash with your normal soap after using and rinsing off  the CHG Soap.                9.  Pat yourself dry with a clean towel.            10.  Wear clean pajamas.            11.  Place clean sheets on your bed the night of your first shower and do not  sleep with pets. Day of Surgery : Do not apply any lotions/deodorants the morning of surgery.  Please wear clean clothes to the hospital/surgery center.  IF YOU HAVE ANY SKIN IRRITATION OR PROBLEMS WITH THE SURGICAL SOAP, PLEASE GET A BAR OF GOLD DIAL SOAP AND SHOWER THE NIGHT BEFORE YOUR SURGERY AND THE MORNING OF YOUR SURGERY. PLEASE LET THE NURSE KNOW MORNING OF YOUR SURGERY IF YOU HAD ANY PROBLEMS WITH THE SURGICAL SOAP.   ________________________________________________________________________                                                        QUESTIONS [facility] PRE OP NURSE PHONE [phone].

## 2022-05-05 NOTE — Progress Notes (Addendum)
Spoke w/ via phone for pre-op interview---[facility] Lab needs dos---- urine pregnancy              Lab results------05/09/22 lab appt for cbc, type & screen, bmp, ekg, 04/28/22 Echocardiogram in Care Everywhere COVID test -----patient states asymptomatic no test needed Arrive at -------0530 on Tuesday, 05/13/22 NPO after MN NO Solid Food.  Clear liquids from MN until---0430 Med rec completed Medications to take morning of surgery -  Flonase prn, Metoprolol, Apremilast Diabetic medication -----n/a Patient instructed no nail polish to be worn day of surgery Patient instructed to bring photo id and insurance card day of surgery Patient aware to have Driver (ride ) / caregiver    for 24 hours after surgery - husband, Acupuncturist Patient Special Instructions -----Extended recovery instructions given. Pre-Op special Istructions -----none Patient verbalized understanding of instructions that were given at this phone interview. Patient denies shortness of breath, chest pain, fever, cough at this phone intervi

## 2022-05-07 ENCOUNTER — Ambulatory Visit (INDEPENDENT_AMBULATORY_CARE_PROVIDER_SITE_OTHER): Payer: PRIVATE HEALTH INSURANCE | Admitting: Obstetrics & Gynecology

## 2022-05-07 ENCOUNTER — Encounter (HOSPITAL_BASED_OUTPATIENT_CLINIC_OR_DEPARTMENT_OTHER): Payer: Self-pay | Admitting: Obstetrics & Gynecology

## 2022-05-07 VITALS — BP 115/54 | HR 84 | Ht 68.0 in | Wt 243.8 lb

## 2022-05-07 DIAGNOSIS — I447 Left bundle-branch block, unspecified: Secondary | ICD-10-CM

## 2022-05-07 DIAGNOSIS — R87610 Atypical squamous cells of undetermined significance on cytologic smear of cervix (ASC-US): Secondary | ICD-10-CM

## 2022-05-07 DIAGNOSIS — Z975 Presence of (intrauterine) contraceptive device: Secondary | ICD-10-CM | POA: Diagnosis not present

## 2022-05-07 DIAGNOSIS — Z87410 Personal history of cervical dysplasia: Secondary | ICD-10-CM

## 2022-05-09 ENCOUNTER — Encounter (HOSPITAL_COMMUNITY)
Admission: RE | Admit: 2022-05-09 | Discharge: 2022-05-09 | Disposition: A | Discharge status: Home / Self Care | Payer: PRIVATE HEALTH INSURANCE | Source: Ambulatory Visit | Attending: Obstetrics & Gynecology | Admitting: Obstetrics & Gynecology

## 2022-05-09 ENCOUNTER — Other Ambulatory Visit: Payer: Self-pay

## 2022-05-09 DIAGNOSIS — Z01818 Encounter for other preprocedural examination: Secondary | ICD-10-CM | POA: Insufficient documentation

## 2022-05-09 DIAGNOSIS — N879 Dysplasia of cervix uteri, unspecified: Secondary | ICD-10-CM

## 2022-05-09 LAB — CBC
HCT: 40.9 % (ref 36.0–46.0)
Hemoglobin: 14 g/dL (ref 12.0–15.0)
MCH: 31.3 pg (ref 26.0–34.0)
MCHC: 34.2 g/dL (ref 30.0–36.0)
MCV: 91.3 fL (ref 80.0–100.0)
Platelets: 210 10*3/uL (ref 150–400)
RBC: 4.48 MIL/uL (ref 3.87–5.11)
RDW: 11.9 % (ref 11.5–15.5)
WBC: 6.2 10*3/uL (ref 4.0–10.5)
nRBC: 0 % (ref 0.0–0.2)

## 2022-05-09 LAB — BASIC METABOLIC PANEL
Anion gap: 5 (ref 5–15)
BUN: 14 mg/dL (ref 6–20)
CO2: 24 mmol/L (ref 22–32)
Calcium: 9.4 mg/dL (ref 8.9–10.3)
Chloride: 108 mmol/L (ref 98–111)
Creatinine, Ser: 0.78 mg/dL (ref 0.44–1.00)
GFR, Estimated: >60 mL/min (ref >60)
Glucose, Bld: 85 mg/dL (ref 70–99)
Potassium: 4.2 mmol/L (ref 3.5–5.1)
Sodium: 137 mmol/L (ref 135–145)

## 2022-05-10 ENCOUNTER — Encounter (HOSPITAL_BASED_OUTPATIENT_CLINIC_OR_DEPARTMENT_OTHER): Payer: Self-pay | Admitting: Obstetrics & Gynecology

## 2022-05-10 NOTE — Progress Notes (Signed)
51 y.o. G0P0000 Married White or Caucasian female here for discussion of upcoming procedure and to updated history as I have not seen her for several months.    TLH/bilateral salpingectomy/cystoscopy, possible oophorectomy planned due to recurrent abnormal pap smears even after having undergone LEEP 05/2020.  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  Most recent pap smear showed ASCUS with +HR HPV.  Most recent colposcopy was 10/23/2021.  Pt is tired of procedures and desires definitive treatment.  She does understand that vaginal pap smear/HR HPV testing will be needed as well.    Procedure discussed with patient.  Hospital stay, recovery and pain management all discussed.  Risks discussed including but not limited to bleeding, 1% risk of receiving a  transfusion, infection, 3-4% risk of bowel/bladder/ureteral/vascular injury discussed as well as possible need for additional surgery if injury does occur discussed.  DVT/PE and rare risk of death discussed.  My actual complications with prior surgeries discussed.  Vaginal cuff dehiscence discussed.  Hernia formation discussed.  Positioning and incision locations discussed.  Patient aware if pathology abnormal she may need additional treatment.  All questions answered.     Ob Hx:   No LMP recorded. (Menstrual status: IUD).          Sexually active: Yes.   Birth control: IUD Last pap: 08/2021, ASCUS with +HR HPV Last MMG: 01/10/2022 Smoking:  former  Past Surgical History:  Procedure Laterality Date   AUGMENTATION MAMMAPLASTY Bilateral 2006   COLONOSCOPY  07/20/2019   COLPOSCOPY  05/30/2020   colposcopy w/ LEEP   INTRAUTERINE DEVICE (IUD) INSERTION     inserted in 06,removed, then new one inserted 4/11, 12-14-15 mirena removed & another inserted   TONSILLECTOMY AND ADENOIDECTOMY      Past Medical History:  Diagnosis Date   Abnormal Pap smear 2013   neg pap +HPV HR, 16/18 genotype neg, 12/2018 LGSIL HPV HR+   Anxiety    only situational   Arthritis    back,  degenerative disc   Condyloma    Daytime somnolence 2023   Patient follows with [facility], LOV 02/14/22. 02/13/22 sleep study was negative.   Depression    only situational per pt on 05/05/22   DOE (dyspnea on exertion) 2023   Patient follows with [facility], LOV 02/14/22 in Epic.   IBS (irritable bowel syndrome)    LBBB (left bundle branch block) 2019   Patient follows with cardiologist Dr. Sharol Roussel at [facility], LOV 03/12/22 in Epic. / Echocardiogram in Care Everywhere, LVEF 45 % on 04/28/2022. No change from previous echocardiogram. Per pt, she has a f/u appt on 06/12/22 with cardiologist.   Migraines    chronic,no aura, improved as of 05/05/22 per pt   PVCs (premature ventricular contractions) 04/04/2021   symptomatic PVCs w/ DOE    Allergies: Penicillins  Current Outpatient Medications  Medication Sig Dispense Refill   Apremilast 30 MG TABS Take by mouth 2 (two) times daily.     Ascorbic Acid (VITAMIN C PO) Take by mouth.     clobetasol ointment (TEMOVATE) 0.05 % 2 (two) times daily as needed.     cyclobenzaprine (FLEXERIL) 10 MG tablet Take 1 tablet (10 mg total) by mouth at bedtime. (Patient taking differently: Take 10 mg by mouth at bedtime as needed.) 90 tablet 3   fluticasone (FLONASE) 50 MCG/ACT nasal spray Place into both nostrils daily.     levonorgestrel (MIRENA) 20 MCG/24HR IUD 1 each by Intrauterine route once.  metoprolol tartrate (LOPRESSOR) 50 MG tablet Take 50 mg by mouth 2 (two) times daily.     Multiple Vitamins-Minerals (MULTIVITAMIN PO) Take by mouth as needed.     naproxen (NAPROSYN) 500 MG tablet Take 1 tablet (500 mg total) by mouth 2 (two) times daily with a meal. (Patient taking differently: Take 500 mg by mouth 2 (two) times daily as needed.) 60 tablet 3   Probiotic Product (PROBIOTIC PO) Take by mouth.     No current facility-administered medications for this visit.    ROS: A comprehensive review of systems  was negative.  Exam:   BP (!) 115/54 (BP Location: Right Arm, Patient Position: Sitting, Cuff Size: Large)   Pulse 84   Ht 5\' 8"  (1.727 m) Comment: reported  Wt 243 lb 12.8 oz (110.6 kg)   BMI 37.07 kg/m   General appearance: alert and cooperative Head: Normocephalic, without obvious abnormality, atraumatic Neck: no adenopathy, supple, symmetrical, trachea midline and thyroid not enlarged, symmetric, no tenderness/mass/nodules Lungs: clear to auscultation bilaterally Heart: regular rate and rhythm, S1, S2 normal, no murmur, click, rub or gallop Abdomen: soft, non-tender; bowel sounds normal; no masses,  no organomegaly Extremities: extremities normal, atraumatic, no cyanosis or edema Skin: Skin color, texture, turgor normal. No rashes or lesions Lymph nodes: Cervical, supraclavicular, and axillary nodes normal. no inguinal nodes palpated Neurologic: Grossly normal  Pelvic: No exam performed today   Assessment/Plan: 1. Atypical squamous cells of undetermined significance on cytologic smear of cervix (ASC-US) - surgical procedure planned - Pre and post op instructions reviewed - Post op pain medications reviewed  2. History of cervical dysplasia  3. IUD (intrauterine device) in place  4. LBBB (left bundle branch block)   Total time with pt 24 minutes

## 2022-05-12 MED ORDER — METRONIDAZOLE 500 MG/100ML IV SOLN
500.0000 mg | INTRAVENOUS | Status: AC
  Administered 2022-05-13: 500 mg via INTRAVENOUS

## 2022-05-12 MED ORDER — GENTAMICIN SULFATE 40 MG/ML IJ SOLN
5.0000 mg/kg | INTRAVENOUS | Status: AC
  Administered 2022-05-13: 409.6 mg via INTRAVENOUS
  Filled 2022-05-12: qty 10.25

## 2022-05-12 NOTE — Anesthesia Preprocedure Evaluation (Signed)
Anesthesia Evaluation  Patient identified by MRN, date of birth, ID band Patient awake    Reviewed: Allergy & Precautions, NPO status , Patient's Chart, lab work & pertinent test results, reviewed documented beta blocker date and time   History of Anesthesia Complications Negative for: history of anesthetic complications  Airway Mallampati: II  TM Distance: >3 FB Neck ROM: Full    Dental  (+) Dental Advisory Given   Pulmonary former smoker,    breath sounds clear to auscultation       Cardiovascular hypertension, Pt. on medications and Pt. on home beta blockers (-) angina+ DOE  + dysrhythmias (occas bigeminy)  Rhythm:Regular Rate:Normal  04/28/2022 ECHO: There is mild concentric LVH. Abnormal  (paradoxical) septal motion consistent with LBBB. There is mild global hypokinesis of the LV. LV ejection fraction = 45%. There is mild, Grade I DD, no significant valvular abnormalities  Pt has seen Cardiology, who has deferred further w/u of LBBB given patient's lack of symptoms even when HR accelerates with exercise.   Neuro/Psych  Headaches, Anxiety Depression    GI/Hepatic negative GI ROS, Neg liver ROS,   Endo/Other  Morbid obesity [facility])  Renal/GU negative Renal ROS     Musculoskeletal  (+) Arthritis ,   Abdominal (+) + obese,   Peds  Hematology   Anesthesia Other Findings   Reproductive/Obstetrics                            Anesthesia Physical Anesthesia Plan  ASA: 3  Anesthesia Plan: General   Post-op Pain Management: Tylenol PO (pre-op)*   Induction: Intravenous  PONV Risk Score and Plan: 3 and Ondansetron, Dexamethasone and Scopolamine patch - Pre-op  Airway Management Planned: Oral ETT  Additional Equipment: None  Intra-op Plan:   Post-operative Plan: Extubation in OR  Informed Consent: I have reviewed the patients History and Physical, chart, labs and discussed the  procedure including the risks, benefits and alternatives for the proposed anesthesia with the patient or authorized representative who has indicated his/her understanding and acceptance.     Dental advisory given  Plan Discussed with: CRNA and Surgeon  Anesthesia Plan Comments:        Anesthesia Quick Evaluation

## 2022-05-13 ENCOUNTER — Other Ambulatory Visit (HOSPITAL_BASED_OUTPATIENT_CLINIC_OR_DEPARTMENT_OTHER): Payer: Self-pay | Admitting: Obstetrics & Gynecology

## 2022-05-13 ENCOUNTER — Other Ambulatory Visit (HOSPITAL_COMMUNITY): Payer: Self-pay

## 2022-05-13 ENCOUNTER — Encounter (HOSPITAL_BASED_OUTPATIENT_CLINIC_OR_DEPARTMENT_OTHER): Payer: Self-pay | Admitting: Obstetrics & Gynecology

## 2022-05-13 ENCOUNTER — Ambulatory Visit (HOSPITAL_BASED_OUTPATIENT_CLINIC_OR_DEPARTMENT_OTHER): Payer: PRIVATE HEALTH INSURANCE | Admitting: Anesthesiology

## 2022-05-13 ENCOUNTER — Encounter (HOSPITAL_BASED_OUTPATIENT_CLINIC_OR_DEPARTMENT_OTHER)
Admission: RE | Disposition: A | Discharge status: Home / Self Care | Payer: Self-pay | Source: Home / Self Care | Attending: Obstetrics & Gynecology

## 2022-05-13 ENCOUNTER — Ambulatory Visit (HOSPITAL_BASED_OUTPATIENT_CLINIC_OR_DEPARTMENT_OTHER)
Admission: RE | Admit: 2022-05-13 | Discharge: 2022-05-13 | Disposition: A | Discharge status: Home / Self Care | Payer: PRIVATE HEALTH INSURANCE | Attending: Obstetrics & Gynecology | Admitting: Obstetrics & Gynecology

## 2022-05-13 ENCOUNTER — Other Ambulatory Visit: Payer: Self-pay

## 2022-05-13 DIAGNOSIS — M1990 Unspecified osteoarthritis, unspecified site: Secondary | ICD-10-CM

## 2022-05-13 DIAGNOSIS — Z01818 Encounter for other preprocedural examination: Secondary | ICD-10-CM

## 2022-05-13 DIAGNOSIS — Z6836 Body mass index (BMI) 36.0-36.9, adult: Secondary | ICD-10-CM | POA: Insufficient documentation

## 2022-05-13 DIAGNOSIS — N72 Inflammatory disease of cervix uteri: Secondary | ICD-10-CM | POA: Diagnosis not present

## 2022-05-13 DIAGNOSIS — N8003 Adenomyosis of the uterus: Secondary | ICD-10-CM

## 2022-05-13 DIAGNOSIS — F418 Other specified anxiety disorders: Secondary | ICD-10-CM | POA: Diagnosis not present

## 2022-05-13 DIAGNOSIS — N879 Dysplasia of cervix uteri, unspecified: Secondary | ICD-10-CM | POA: Diagnosis present

## 2022-05-13 DIAGNOSIS — M7711 Lateral epicondylitis, right elbow: Secondary | ICD-10-CM

## 2022-05-13 DIAGNOSIS — R0609 Other forms of dyspnea: Secondary | ICD-10-CM | POA: Diagnosis not present

## 2022-05-13 DIAGNOSIS — N870 Mild cervical dysplasia: Secondary | ICD-10-CM | POA: Insufficient documentation

## 2022-05-13 DIAGNOSIS — Z87891 Personal history of nicotine dependence: Secondary | ICD-10-CM

## 2022-05-13 DIAGNOSIS — I10 Essential (primary) hypertension: Secondary | ICD-10-CM

## 2022-05-13 HISTORY — PX: CYSTOSCOPY: SHX5120

## 2022-05-13 HISTORY — DX: Anxiety disorder, unspecified: F41.9

## 2022-05-13 HISTORY — PX: TOTAL LAPAROSCOPIC HYSTERECTOMY WITH SALPINGECTOMY: SHX6742

## 2022-05-13 HISTORY — PX: IUD REMOVAL: SHX5392

## 2022-05-13 HISTORY — DX: Unspecified osteoarthritis, unspecified site: M19.90

## 2022-05-13 HISTORY — DX: Depression, unspecified: F32.A

## 2022-05-13 LAB — HEMOGLOBIN: Hemoglobin: 12.5 g/dL (ref 12.0–15.0)

## 2022-05-13 LAB — TYPE AND SCREEN
ABO/RH(D): A NEG
Antibody Screen: NEGATIVE

## 2022-05-13 LAB — POCT PREGNANCY, URINE: Preg Test, Ur: NEGATIVE

## 2022-05-13 LAB — ABO/RH: ABO/RH(D): A NEG

## 2022-05-13 SURGERY — HYSTERECTOMY, TOTAL, LAPAROSCOPIC, WITH SALPINGECTOMY
Anesthesia: General | Site: Abdomen

## 2022-05-13 MED ORDER — KETOROLAC TROMETHAMINE 30 MG/ML IJ SOLN
INTRAMUSCULAR | Status: DC | PRN
  Administered 2022-05-13: 30 mg via INTRAVENOUS

## 2022-05-13 MED ORDER — METOPROLOL TARTRATE 25 MG PO TABS
25.0000 mg | ORAL_TABLET | Freq: Once | ORAL | Status: AC
Start: 2022-05-13 — End: 2022-05-13
  Administered 2022-05-13: 25 mg via ORAL

## 2022-05-13 MED ORDER — IBUPROFEN 800 MG PO TABS
800.0000 mg | ORAL_TABLET | Freq: Three times a day (TID) | ORAL | 0 refills | Status: DC | PRN
  Filled 2022-05-13: qty 30, 10d supply, fill #0

## 2022-05-13 MED ORDER — MIDAZOLAM HCL 2 MG/2ML IJ SOLN
INTRAMUSCULAR | Status: AC
  Filled 2022-05-13: qty 2

## 2022-05-13 MED ORDER — EPHEDRINE SULFATE (PRESSORS) 50 MG/ML IJ SOLN
INTRAMUSCULAR | Status: DC | PRN
  Administered 2022-05-13: 5 mg via INTRAVENOUS
  Administered 2022-05-13 (×2): 10 mg via INTRAVENOUS

## 2022-05-13 MED ORDER — POVIDONE-IODINE 10 % EX SWAB: 2.0000 "application " | Freq: Once | CUTANEOUS | Status: DC

## 2022-05-13 MED ORDER — HYDROMORPHONE HCL 1 MG/ML IJ SOLN
INTRAMUSCULAR | Status: AC
  Filled 2022-05-13: qty 1

## 2022-05-13 MED ORDER — SODIUM CHLORIDE 0.9 % IV SOLN: INTRAVENOUS | Status: DC | PRN

## 2022-05-13 MED ORDER — SIMETHICONE 80 MG PO CHEW: 80.0000 mg | CHEWABLE_TABLET | Freq: Four times a day (QID) | ORAL | Status: DC | PRN

## 2022-05-13 MED ORDER — ONDANSETRON HCL 4 MG/2ML IJ SOLN
INTRAMUSCULAR | Status: AC
  Filled 2022-05-13: qty 2

## 2022-05-13 MED ORDER — METOPROLOL TARTRATE 25 MG PO TABS
ORAL_TABLET | ORAL | Status: AC
  Filled 2022-05-13: qty 1

## 2022-05-13 MED ORDER — SUGAMMADEX SODIUM 200 MG/2ML IV SOLN
INTRAVENOUS | Status: DC | PRN
  Administered 2022-05-13: 200 mg via INTRAVENOUS

## 2022-05-13 MED ORDER — IBUPROFEN 200 MG PO TABS: 600.0000 mg | ORAL_TABLET | Freq: Four times a day (QID) | ORAL | Status: DC

## 2022-05-13 MED ORDER — PROPOFOL 10 MG/ML IV BOLUS
INTRAVENOUS | Status: DC | PRN
  Administered 2022-05-13 (×2): 100 mg via INTRAVENOUS

## 2022-05-13 MED ORDER — OXYCODONE-ACETAMINOPHEN 5-325 MG PO TABS
1.0000 | ORAL_TABLET | ORAL | Status: DC | PRN
  Administered 2022-05-13: 1 via ORAL

## 2022-05-13 MED ORDER — ONDANSETRON HCL 4 MG PO TABS
4.0000 mg | ORAL_TABLET | Freq: Three times a day (TID) | ORAL | 0 refills | Status: DC | PRN
  Filled 2022-05-13: qty 20, 7d supply, fill #0

## 2022-05-13 MED ORDER — MEPERIDINE HCL 25 MG/ML IJ SOLN: 6.2500 mg | INTRAMUSCULAR | Status: DC | PRN

## 2022-05-13 MED ORDER — PROPOFOL 10 MG/ML IV BOLUS
INTRAVENOUS | Status: AC
  Filled 2022-05-13: qty 20

## 2022-05-13 MED ORDER — MORPHINE SULFATE (PF) 4 MG/ML IV SOLN: 1.0000 mg | INTRAVENOUS | Status: DC | PRN

## 2022-05-13 MED ORDER — SCOPOLAMINE 1 MG/3DAYS TD PT72
1.0000 | MEDICATED_PATCH | TRANSDERMAL | Status: DC
  Administered 2022-05-13: 1.5 mg via TRANSDERMAL

## 2022-05-13 MED ORDER — OXYCODONE HCL 5 MG/5ML PO SOLN: 5.0000 mg | Freq: Once | ORAL | Status: DC | PRN

## 2022-05-13 MED ORDER — ACETAMINOPHEN 500 MG PO TABS
1000.0000 mg | ORAL_TABLET | ORAL | Status: AC
  Administered 2022-05-13: 1000 mg via ORAL

## 2022-05-13 MED ORDER — OXYCODONE-ACETAMINOPHEN 5-325 MG PO TABS
1.0000 | ORAL_TABLET | Freq: Four times a day (QID) | ORAL | 0 refills | Status: AC | PRN
  Filled 2022-05-13: qty 30, 5d supply, fill #0

## 2022-05-13 MED ORDER — PHENYLEPHRINE HCL (PRESSORS) 10 MG/ML IV SOLN
INTRAVENOUS | Status: DC | PRN
  Administered 2022-05-13 (×6): 80 ug via INTRAVENOUS

## 2022-05-13 MED ORDER — KETOROLAC TROMETHAMINE 30 MG/ML IJ SOLN
30.0000 mg | Freq: Four times a day (QID) | INTRAMUSCULAR | Status: DC
  Administered 2022-05-13: 30 mg via INTRAVENOUS

## 2022-05-13 MED ORDER — MIDAZOLAM HCL 2 MG/2ML IJ SOLN: 0.5000 mg | Freq: Once | INTRAMUSCULAR | Status: DC | PRN

## 2022-05-13 MED ORDER — MIDAZOLAM HCL 5 MG/5ML IJ SOLN
INTRAMUSCULAR | Status: DC | PRN
  Administered 2022-05-13: 2 mg via INTRAVENOUS

## 2022-05-13 MED ORDER — OXYCODONE-ACETAMINOPHEN 5-325 MG PO TABS
ORAL_TABLET | ORAL | Status: AC
  Filled 2022-05-13: qty 1

## 2022-05-13 MED ORDER — ROCURONIUM BROMIDE 10 MG/ML (PF) SYRINGE
PREFILLED_SYRINGE | INTRAVENOUS | Status: AC
  Filled 2022-05-13: qty 10

## 2022-05-13 MED ORDER — FENTANYL CITRATE (PF) 100 MCG/2ML IJ SOLN
INTRAMUSCULAR | Status: DC | PRN
  Administered 2022-05-13 (×3): 25 ug via INTRAVENOUS
  Administered 2022-05-13: 200 ug via INTRAVENOUS
  Administered 2022-05-13: 25 ug via INTRAVENOUS
  Administered 2022-05-13: 50 ug via INTRAVENOUS

## 2022-05-13 MED ORDER — ENOXAPARIN SODIUM 40 MG/0.4ML IJ SOSY
PREFILLED_SYRINGE | INTRAMUSCULAR | Status: AC
  Filled 2022-05-13: qty 0.4

## 2022-05-13 MED ORDER — ACETAMINOPHEN 500 MG PO TABS
ORAL_TABLET | ORAL | Status: AC
  Filled 2022-05-13: qty 2

## 2022-05-13 MED ORDER — POVIDONE-IODINE 10 % EX SWAB: 2.0000 | Freq: Once | CUTANEOUS | Status: DC

## 2022-05-13 MED ORDER — FENTANYL CITRATE (PF) 100 MCG/2ML IJ SOLN
INTRAMUSCULAR | Status: AC
  Filled 2022-05-13: qty 2

## 2022-05-13 MED ORDER — GABAPENTIN 100 MG PO CAPS
ORAL_CAPSULE | ORAL | Status: AC
  Filled 2022-05-13: qty 1

## 2022-05-13 MED ORDER — ENOXAPARIN SODIUM 40 MG/0.4ML IJ SOSY
40.0000 mg | PREFILLED_SYRINGE | INTRAMUSCULAR | Status: AC
  Administered 2022-05-13: 40 mg via SUBCUTANEOUS

## 2022-05-13 MED ORDER — DEXTROSE-NACL 5-0.45 % IV SOLN: INTRAVENOUS | Status: DC

## 2022-05-13 MED ORDER — ONDANSETRON HCL 4 MG/2ML IJ SOLN
INTRAMUSCULAR | Status: DC | PRN
  Administered 2022-05-13: 4 mg via INTRAVENOUS

## 2022-05-13 MED ORDER — HYDROMORPHONE HCL 1 MG/ML IJ SOLN
0.2500 mg | INTRAMUSCULAR | Status: DC | PRN
  Administered 2022-05-13: 0.25 mg via INTRAVENOUS

## 2022-05-13 MED ORDER — MENTHOL 3 MG MT LOZG: 1.0000 | LOZENGE | OROMUCOSAL | Status: DC | PRN

## 2022-05-13 MED ORDER — OXYCODONE HCL 5 MG PO TABS: 5.0000 mg | ORAL_TABLET | Freq: Once | ORAL | Status: DC | PRN

## 2022-05-13 MED ORDER — DEXAMETHASONE SODIUM PHOSPHATE 4 MG/ML IJ SOLN
INTRAMUSCULAR | Status: DC | PRN
  Administered 2022-05-13: 10 mg via INTRAVENOUS

## 2022-05-13 MED ORDER — LACTATED RINGERS IV SOLN: INTRAVENOUS | Status: DC

## 2022-05-13 MED ORDER — SCOPOLAMINE 1 MG/3DAYS TD PT72
MEDICATED_PATCH | TRANSDERMAL | Status: AC
  Filled 2022-05-13: qty 1

## 2022-05-13 MED ORDER — PROMETHAZINE HCL 25 MG/ML IJ SOLN: 6.2500 mg | INTRAMUSCULAR | Status: DC | PRN

## 2022-05-13 MED ORDER — KETOROLAC TROMETHAMINE 30 MG/ML IJ SOLN
INTRAMUSCULAR | Status: AC
  Filled 2022-05-13: qty 1

## 2022-05-13 MED ORDER — NAPROXEN 500 MG PO TABS: 500.0000 mg | ORAL_TABLET | Freq: Two times a day (BID) | ORAL | 3 refills | Status: DC

## 2022-05-13 MED ORDER — GABAPENTIN 100 MG PO CAPS
100.0000 mg | ORAL_CAPSULE | Freq: Three times a day (TID) | ORAL | Status: DC
  Administered 2022-05-13: 100 mg via ORAL

## 2022-05-13 MED ORDER — FENTANYL CITRATE (PF) 250 MCG/5ML IJ SOLN
INTRAMUSCULAR | Status: AC
  Filled 2022-05-13: qty 5

## 2022-05-13 MED ORDER — LIDOCAINE HCL (CARDIAC) PF 100 MG/5ML IV SOSY
PREFILLED_SYRINGE | INTRAVENOUS | Status: DC | PRN
  Administered 2022-05-13: 60 mg via INTRAVENOUS

## 2022-05-13 MED ORDER — BUPIVACAINE HCL (PF) 0.25 % IJ SOLN
INTRAMUSCULAR | Status: DC | PRN
  Administered 2022-05-13: 10 mL

## 2022-05-13 MED ORDER — LIDOCAINE HCL (PF) 2 % IJ SOLN
INTRAMUSCULAR | Status: AC
  Filled 2022-05-13: qty 5

## 2022-05-13 MED ORDER — ROCURONIUM BROMIDE 100 MG/10ML IV SOLN
INTRAVENOUS | Status: DC | PRN
  Administered 2022-05-13 (×2): 10 mg via INTRAVENOUS
  Administered 2022-05-13: 70 mg via INTRAVENOUS

## 2022-05-13 MED ORDER — DEXAMETHASONE SODIUM PHOSPHATE 10 MG/ML IJ SOLN
INTRAMUSCULAR | Status: AC
  Filled 2022-05-13: qty 1

## 2022-05-13 MED ORDER — METRONIDAZOLE 500 MG/100ML IV SOLN
INTRAVENOUS | Status: AC
  Filled 2022-05-13: qty 100

## 2022-05-13 MED ORDER — PANTOPRAZOLE SODIUM 40 MG IV SOLR: 40.0000 mg | Freq: Every day | INTRAVENOUS | Status: DC

## 2022-05-13 MED ORDER — ALUM & MAG HYDROXIDE-SIMETH 200-200-20 MG/5ML PO SUSP: 30.0000 mL | ORAL | Status: DC | PRN

## 2022-05-13 SURGICAL SUPPLY — 44 items
APPLICATOR ARISTA FLEXITIP XL (MISCELLANEOUS) ×1 IMPLANT
CHLORAPREP W/TINT 26 (MISCELLANEOUS) ×4 IMPLANT
COVER BACK TABLE 60X90IN (DRAPES) ×4 IMPLANT
COVER MAYO STAND STRL (DRAPES) ×4 IMPLANT
COVER SURGICAL LIGHT HANDLE (MISCELLANEOUS) ×1 IMPLANT
DERMABOND ADVANCED (GAUZE/BANDAGES/DRESSINGS) ×1
DERMABOND ADVANCED .7 DNX12 (GAUZE/BANDAGES/DRESSINGS) ×3 IMPLANT
GAUZE 4X4 16PLY ~~LOC~~+RFID DBL (SPONGE) ×7 IMPLANT
GLOVE BIO SURGEON STRL SZ 6.5 (GLOVE) ×6 IMPLANT
GLOVE BIOGEL PI IND STRL 6.5 (GLOVE) ×3 IMPLANT
GLOVE BIOGEL PI IND STRL 7.0 (GLOVE) ×6 IMPLANT
GLOVE BIOGEL PI INDICATOR 6.5 (GLOVE) ×1
GLOVE BIOGEL PI INDICATOR 7.0 (GLOVE) ×2
GLOVE ECLIPSE 6.5 STRL STRAW (GLOVE) ×8 IMPLANT
GOWN STRL REUS W/TWL LRG LVL3 (GOWN DISPOSABLE) ×2 IMPLANT
GOWN STRL REUS W/TWL XL LVL3 (GOWN DISPOSABLE) ×8 IMPLANT
HARMONIC RUM II 2.5CM SILVER (DISPOSABLE) ×4
HEMOSTAT ARISTA ABSORB 3G PWDR (HEMOSTASIS) ×1 IMPLANT
KIT TURNOVER CYSTO (KITS) ×4 IMPLANT
LIGASURE VESSEL 5MM BLUNT TIP (ELECTROSURGICAL) ×4 IMPLANT
NEEDLE INSUFFLATION 120MM (ENDOMECHANICALS) ×4 IMPLANT
NS IRRIG 1000ML POUR BTL (IV SOLUTION) ×4 IMPLANT
OCCLUDER COLPOPNEUMO (BALLOONS) ×4 IMPLANT
PACK LAPAROSCOPY BASIN (CUSTOM PROCEDURE TRAY) ×4 IMPLANT
PACK TRENDGUARD 450 HYBRID PRO (MISCELLANEOUS) ×3 IMPLANT
POUCH LAPAROSCOPIC INSTRUMENT (MISCELLANEOUS) ×4 IMPLANT
SCALPEL HRMNC RUM II 2.5 SILVR (DISPOSABLE) IMPLANT
SET IRRIG Y TYPE TUR BLADDER L (SET/KITS/TRAYS/PACK) ×4 IMPLANT
SET SUCTION IRRIG HYDROSURG (IRRIGATION / IRRIGATOR) ×4 IMPLANT
SET TRI-LUMEN FLTR TB AIRSEAL (TUBING) ×4 IMPLANT
SHEARS HARMONIC ACE PLUS 36CM (ENDOMECHANICALS) ×4 IMPLANT
SUT VIC AB 4-0 PS2 18 (SUTURE) ×4 IMPLANT
SUT VLOC 180 0 9IN  GS21 (SUTURE) ×4
SUT VLOC 180 0 9IN GS21 (SUTURE) ×3 IMPLANT
SYR 10ML LL (SYRINGE) ×4 IMPLANT
SYR 50ML LL SCALE MARK (SYRINGE) ×8 IMPLANT
TIP UTERINE 5.1X6CM LAV DISP (MISCELLANEOUS) ×1 IMPLANT
TOWEL OR 17X26 10 PK STRL BLUE (TOWEL DISPOSABLE) ×8 IMPLANT
TRAY FOLEY W/BAG SLVR 14FR LF (SET/KITS/TRAYS/PACK) ×4 IMPLANT
TRENDGUARD 450 HYBRID PRO PACK (MISCELLANEOUS) ×4
TROCAR ADV FIXATION 5X100MM (TROCAR) ×4 IMPLANT
TROCAR BLADELESS OPT 5 100 (ENDOMECHANICALS) ×4 IMPLANT
TROCAR PORT AIRSEAL 5X120 (TROCAR) ×4 IMPLANT
TROCAR XCEL NON BLADE 8MM B8LT (ENDOMECHANICALS) ×4 IMPLANT

## 2022-05-13 NOTE — Transfer of Care (Signed)
Immediate Anesthesia Transfer of Care Note  Patient: Samantha Bird  Procedure(s) Performed: Procedure(s) (LRB): TOTAL LAPAROSCOPIC HYSTERECTOMY WITH BILATERAL SALPINGECTOMY (Bilateral) CYSTOSCOPY (N/A)  Patient Location: PACU  Anesthesia Type: General  Level of Consciousness: awake, sedated, patient cooperative and responds to stimulation  Airway & Oxygen Therapy: Patient Spontanous Breathing and Patient connected to [place] oxygen  Post-op Assessment: Report given to PACU RN, Post -op Vital signs reviewed and stable and Patient moving all extremities  Post vital signs: Reviewed and stable  Complications: No apparent anesthesia complications

## 2022-05-13 NOTE — Anesthesia Postprocedure Evaluation (Signed)
Anesthesia Post Note  Patient: SHACOLA SCHUSSLER  Procedure(s) Performed: TOTAL LAPAROSCOPIC HYSTERECTOMY WITH BILATERAL SALPINGECTOMY (Bilateral: Abdomen) CYSTOSCOPY     Patient location during evaluation: Nursing Unit Anesthesia Type: General Level of consciousness: awake and alert, patient cooperative and oriented Pain management: pain level controlled Vital Signs Assessment: post-procedure vital signs reviewed and stable Respiratory status: spontaneous breathing, nonlabored ventilation and respiratory function stable Cardiovascular status: blood pressure returned to baseline and stable Postop Assessment: no apparent nausea or vomiting, able to ambulate and adequate PO intake Anesthetic complications: no   No notable events documented.  Last Vitals:  Vitals:   05/13/22 1045 05/13/22 1106  BP: 113/74 111/67  Pulse: 68 73  Resp: 15 15  Temp:  36.7 C  SpO2: 97% 99%    Last Pain:  Vitals:   05/13/22 1106  TempSrc:   PainSc: 3                  Rasaan Brotherton,E. Nishika Parkhurst

## 2022-05-13 NOTE — Progress Notes (Signed)
Husband called and given update.  All is well, took a little bit to get started but all going well.

## 2022-05-13 NOTE — Anesthesia Procedure Notes (Signed)
Procedure Name: Intubation Date/Time: 05/13/2022 7:45 AM  Performed by: Justice Rocher, CRNAPre-anesthesia Checklist: Patient identified, Emergency Drugs available, Suction available, Patient being monitored and Timeout performed Patient Re-evaluated:Patient Re-evaluated prior to induction Oxygen Delivery Method: Circle system utilized Preoxygenation: Pre-oxygenation with 100% oxygen Induction Type: IV induction Ventilation: Mask ventilation without difficulty Laryngoscope Size: Mac and 3 Grade View: Grade II Tube type: Oral Tube size: 7.0 mm Number of attempts: 1 Airway Equipment and Method: Stylet and Oral airway Placement Confirmation: ETT inserted through vocal cords under direct vision, positive ETCO2, breath sounds checked- equal and bilateral and CO2 detector Secured at: 23 cm Tube secured with: Tape Dental Injury: Teeth and Oropharynx as per pre-operative assessment

## 2022-05-13 NOTE — H&P (Signed)
Samantha Bird is an 51 y.o. female G0 MWF with hx of recurrent abnormal pap smears here for definitive treatment with hysterectomy.  Pt's pap smears have been abnormal since 2020.  She has several colposcopies and undergone a LEEP 05/2020.  Pap smears and pathology has remained abnormal and pt is desirous of definitive treatment.  Risks, benefits and alteratives including continued monitoring have all been discussed.  Pt here and ready to proceed.  Pertinent Gynecological History: Menses: flow is light Contraception:  IUD DES exposure: denies Blood transfusions: none Previous GYN Procedures:  LEEP   Last mammogram: normal Date: 12/2021 Last pap: abnormal: ASCUS with +HR HPV OB History: G0, P0   Menstrual History: No LMP recorded. (Menstrual status: IUD).    Past Medical History:  Diagnosis Date   Abnormal Pap smear 2013   neg pap +HPV HR, 16/18 genotype neg, 12/2018 LGSIL HPV HR+   Anxiety    only situational   Arthritis    back, degenerative disc   Condyloma    Daytime somnolence 2023   Patient follows with [facility], LOV 02/14/22. 02/13/22 sleep study was negative.   Depression    only situational per pt on 05/05/22   DOE (dyspnea on exertion) 2023   Patient follows with [facility], LOV 02/14/22 in Epic.   IBS (irritable bowel syndrome)    LBBB (left bundle branch block) 2019   Patient follows with cardiologist Dr. Sharol Roussel at [facility], LOV 03/12/22 in Epic. / Echocardiogram in Care Everywhere, LVEF 45 % on 04/28/2022. No change from previous echocardiogram. Per pt, she has a f/u appt on 06/12/22 with cardiologist.   Migraines    chronic,no aura, improved as of 05/05/22 per pt   PVCs (premature ventricular contractions) 04/04/2021   symptomatic PVCs w/ DOE    Past Surgical History:  Procedure Laterality Date   AUGMENTATION MAMMAPLASTY Bilateral 2006   COLONOSCOPY  07/20/2019   COLPOSCOPY  05/30/2020   colposcopy w/ LEEP    INTRAUTERINE DEVICE (IUD) INSERTION     inserted in 06,removed, then new one inserted 4/11, 12-14-15 mirena removed & another inserted   TONSILLECTOMY AND ADENOIDECTOMY      Family History  Problem Relation Age of Onset   Hypertension Brother    Diabetes Mother    Heart attack Father     Social History:  reports that she has quit smoking. Her smoking use included cigarettes. She has never used smokeless tobacco. She reports that she does not currently use alcohol. She reports that she does not use drugs.  Allergies:  Allergies  Allergen Reactions   Penicillins Hives and Swelling    As a child     Medications Prior to Admission  Medication Sig Dispense Refill Last Dose   Apremilast 30 MG TABS Take by mouth 2 (two) times daily.   05/12/2022   Ascorbic Acid (VITAMIN C PO) Take by mouth.   Past Week   clobetasol ointment (TEMOVATE) 0.05 % 2 (two) times daily as needed.   05/12/2022   cyclobenzaprine (FLEXERIL) 10 MG tablet Take 1 tablet (10 mg total) by mouth at bedtime. (Patient taking differently: Take 10 mg by mouth at bedtime as needed.) 90 tablet 3 Past Week   fluticasone (FLONASE) 50 MCG/ACT nasal spray Place into both nostrils daily.   05/12/2022   levonorgestrel (MIRENA) 20 MCG/24HR IUD 1 each by Intrauterine route once.   05/13/2022   metoprolol tartrate (LOPRESSOR) 50 MG tablet Take 50 mg by mouth  2 (two) times daily.   05/12/2022   Multiple Vitamins-Minerals (MULTIVITAMIN PO) Take by mouth as needed.   05/12/2022   Probiotic Product (PROBIOTIC PO) Take by mouth.   05/12/2022   naproxen (NAPROSYN) 500 MG tablet Take 1 tablet (500 mg total) by mouth 2 (two) times daily with a meal. (Patient taking differently: Take 500 mg by mouth 2 (two) times daily as needed.) 60 tablet 3 05/10/2022    Review of Systems  All other systems reviewed and are negative.   Blood pressure 128/72, pulse 79, temperature 98.1 F (36.7 C), temperature source Oral, resp. rate 17, height 5\' 8"  (1.727 m),  weight 110.2 kg, SpO2 99 %. Physical Exam Constitutional:      Appearance: Normal appearance.  Cardiovascular:     Rate and Rhythm: Normal rate and regular rhythm.  Pulmonary:     Effort: Pulmonary effort is normal.     Breath sounds: Normal breath sounds.  Neurological:     General: No focal deficit present.     Mental Status: She is alert.  Psychiatric:        Mood and Affect: Mood normal.     Results for orders placed or performed during the hospital encounter of 05/13/22 (from the past 24 hour(s))  Pregnancy, urine POC     Status: None   Collection Time: 05/13/22  5:40 AM  Result Value Ref Range   Preg Test, Ur NEGATIVE NEGATIVE    No results found.  Assessment/Plan: 67 G0 MWF with recurrent abnormal pap smears here for definitive surgery with TLH/bilateral salpingectomy/cystoscopy.  Questions answered.  Pt ready to proceed.  40 05/13/2022, 7:11 AM

## 2022-05-13 NOTE — Discharge Summary (Signed)
Physician Discharge Summary  Patient ID: Samantha Bird MRN: [id number] DOB/AGE: 1971/04/30 51 y.o.  Admit date: 05/13/2022 Discharge date: 05/13/2022  Admission Diagnoses: cervical dysplasia, persistent abnormal pap smears  Discharge Diagnoses:  Principal Problem:   Cervical dysplasia   Discharged Condition: good  Hospital Course: Patient admitted through same day surgery.  She was taken to OR where TLH/bilateral salpingectomy/cystoscopy were performed.  Surgical findings included small uterus, normal ovaries.  Surgery was uneventful.  EBL 25cc.  Foley catheter was removed before leaving OR.  Patient transferred to PACU where she was stable and then to the [facility] for the remainder of her hospitalization.  During her post-op recovery, her vitals and stable and she was AF.  In afternoon of POD#0, she was able to transition to oral pain medications and regular diet.  She was able to ambulate and she had good pain control.  She was also able to void on her own.  Post op hb was 12.5.  At this point, patient was voiding, walking, having excellent pain control, had no nausea, and minimal vaginal bleeding.  She was ready for D/C and desired to go home same day as surgery.  Consults: None  Significant Diagnostic Studies: labs: post op hb 12.5  Treatments: surgery: TLH/bilateral salpingectomy/cystoscopy  Discharge Exam: Blood pressure 124/73, pulse 85, temperature (!) 97.4 F (36.3 C), resp. rate 13, height 5\' 8"  (1.727 m), weight 110.2 kg, SpO2 95 %. General appearance: alert and cooperative Resp: clear to auscultation bilaterally Cardio: regular rate and rhythm, S1, S2 normal, no murmur, click, rub or gallop GI: soft, mildly tender, good BS Extremities: extremities normal, atraumatic, no cyanosis or edema Incision/Wound: C/D/I  Disposition: Discharge disposition: 01-Home or Self Care       Discharge Instructions     Call MD for:   Complete by: As directed    Heavy vaginal  bleeding, like a menstrual cycle   Call MD for:  persistant nausea and vomiting   Complete by: As directed    Call MD for:  redness, tenderness, or signs of infection (pain, swelling, redness, odor or green/yellow discharge around incision site)   Complete by: As directed    Call MD for:  severe uncontrolled pain   Complete by: As directed    Call MD for:  temperature >100.4   Complete by: As directed    Diet general   Complete by: As directed    Discharge wound care:   Complete by: As directed    Use soap and water, only, on incisions   Driving Restrictions   Complete by: As directed    May drive in one week   Increase activity slowly   Complete by: As directed    Lifting restrictions   Complete by: As directed    No heavy lifting (>15 pounds) for 4 weeks   May shower / Bathe   Complete by: As directed    May shower on post-operative day 1.  May take a tub bath in one week.   Other Restrictions   Complete by: As directed    Return to work as discussed with Dr.   Sexual Activity Restrictions   Complete by: As directed    Nothing in the vagina for 12 weeks, intercourse/tampons/douching      Allergies as of 05/13/2022       Reactions   Penicillins Hives, Swelling   As a child        Medication List     STOP taking  these medications    levonorgestrel 20 MCG/24HR IUD Commonly known as: MIRENA       TAKE these medications    Apremilast 30 MG Tabs Take by mouth 2 (two) times daily.   clobetasol ointment 0.05 % Commonly known as: TEMOVATE 2 (two) times daily as needed.   cyclobenzaprine 10 MG tablet Commonly known as: FLEXERIL Take 1 tablet (10 mg total) by mouth at bedtime. What changed:  when to take this reasons to take this   fluticasone 50 MCG/ACT nasal spray Commonly known as: FLONASE Place into both nostrils daily.   ibuprofen 800 MG tablet Commonly known as: ADVIL Take 1 tablet (800 mg total) by mouth every 8 (eight) hours as  needed. Notes to patient: Take next dose at 11:00 pm tonight   metoprolol tartrate 50 MG tablet Commonly known as: LOPRESSOR Take 50 mg by mouth 2 (two) times daily.   MULTIVITAMIN PO Take by mouth as needed.   naproxen 500 MG tablet Commonly known as: NAPROSYN Take 1 tablet (500 mg total) by mouth 2 (two) times daily with a meal. Don't take while you are taking the Motrin. What changed: additional instructions   ondansetron 4 MG tablet Commonly known as: Zofran Take 1 tablet (4 mg total) by mouth every 8 (eight) hours as needed for nausea or vomiting.   oxyCODONE-acetaminophen 5-325 MG tablet Commonly known as: Percocet Take 1-2 tablets by mouth every 6 (six) hours as needed for up to 5 days. use only as much as needed to relieve pain Notes to patient: May take next dose at 9:00 pm tonight   PROBIOTIC PO Take by mouth.   VITAMIN C PO Take by mouth.               Discharge Care Instructions  (From admission, onward)           Start     Ordered   05/13/22 0000  Discharge wound care:       Comments: Use soap and water, only, on incisions   05/13/22 1539            Follow-up Information     Jerene Bears, MD Follow up in 1 week(s).   Specialty: Obstetrics and Gynecology Contact information: [address] [place] [address] [phone]                 Signed: Jerene Bears 05/13/2022, 3:42 PM

## 2022-05-13 NOTE — Progress Notes (Signed)
Day of Surgery Procedure(s) (LRB): TOTAL LAPAROSCOPIC HYSTERECTOMY WITH BILATERAL SALPINGECTOMY (Bilateral) CYSTOSCOPY (N/A) INTRAUTERINE DEVICE (IUD) REMOVAL  Subjective: Patient reports she's having some urinary urgency.  Otherwise, she has no complaint.  Pain is under good control.   Denies nausea.  Has voided with good volumes multiple times.  No palpitations, SOB or dizziness  Objective: I have reviewed patient's vital signs, intake and output, medications, and labs.  General: alert and cooperative Resp: clear to auscultation bilaterally Cardio: regular rate and rhythm, S1, S2 normal, no murmur, click, rub or gallop GI: sot, mildly tender, good BS Extremities: extremities normal, atraumatic, no cyanosis or edema Vaginal Bleeding: minimal  Assessment: s/p Procedure(s): TOTAL LAPAROSCOPIC HYSTERECTOMY WITH BILATERAL SALPINGECTOMY (Bilateral) CYSTOSCOPY (N/A) INTRAUTERINE DEVICE (IUD) REMOVAL: progressing well and pt desires discharge home.  Plan: Discharge home  LOS: 0 days    Jerene Bears, MD 05/13/2022, 3:36 PM

## 2022-05-13 NOTE — Op Note (Addendum)
05/13/2022  10:59 AM  PATIENT:  Samantha Bird  51 y.o. female  PRE-OPERATIVE DIAGNOSIS:  CIN 1 Cervical Dysplasia  POST-OPERATIVE DIAGNOSIS:  CIN 1 Cervical Dysplasia  PROCEDURE:  Procedure(s): TOTAL LAPAROSCOPIC HYSTERECTOMY WITH BILATERAL SALPINGECTOMY, IUD REMOVAL CYSTOSCOPY  SURGEON:  Jerene Bears  ASSISTANTS: Rayann Heman, MD.  An experienced assistant was required given the standard of surgical care given the complexity of the case.  This assistant was needed for exposure, dissection, suctioning, retraction, instrument exchange and for overall help during the procedure.  RNFA help was also unavailable.  ANESTHESIA:   general  ESTIMATED BLOOD LOSS: 25 mL  BLOOD ADMINISTERED:none   FLUIDS: 2200cc LR  UOP: 150cc  SPECIMEN:  uterus, cervix, bilateral fallopian tubes  DISPOSITION OF SPECIMEN:  PATHOLOGY  FINDINGS: small uterus, normal ovaries, normal upper abdomen, small area of probable endometriosis on the left uterosacral ligament  DESCRIPTION OF OPERATION: Patient is taken to the operating room. She is placed in the supine position. She is a running IV in place. Informed consent was present on the chart. SCDs on her lower extremities and functioning properly. Patient was positioned while she was awake.  Her legs were placed in the low lithotomy position in [place] stirrups. Her arms were tucked by the side.  General endotracheal anesthesia was administered by the anesthesia staff without difficulty. Dr. Sandford Craze, anesthesia, oversaw case.  Time out performed.    Chlora prep was then used to prep the abdomen and Betadine was used to prep the inner thighs, perineum and vagina. Once 3 minutes had past the patient was draped in a normal standard fashion. The legs were lifted to the high lithotomy position. The cervix was visualized by placing a heavy weighted speculum in the posterior aspect of the vagina and using a curved Deaver retractor to the retract anteriorly. The  anterior lip of the cervix was grasped with single-tooth tenaculum.  The cervix sounded to 6 cm. Pratt dilators were used to dilate the cervix up to a #21. IUD was removed at this point.  A RUMI uterine manipulator was obtained. A #6 disposable tip was placed on the RUMI manipulator as well as a 2.5, silver KOH ring. This was passed through the cervix and the bulb of the disposable tip was inflated with 10 cc of normal saline. There was a good fit of the KOH ring around the cervix. The tenaculum was removed. There is also good manipulation of the uterus. The speculum and retractor were removed as well. A Foley catheter was placed to straight drain.  Clear urine was noted. Legs were lowered to the low lithotomy position and attention was turned the abdomen.  The umbilicus was everted.  A Veress needle was obtained. Syringe of sterile saline was placed on a open Veress needle.  This was passed into the umbilicus until just when the fluid started to drip.  Then low flow CO2 gas was attached the needle and the pneumoperitoneum was achieved without difficulty. Once four liters of gas was in the abdomen the Veress needle was removed and a 5 millimeter non-bladed Optiview trocar and port were passed directly to the abdomen. The laparoscope was then used to confirm intraperitoneal placement. Findings noted above.  Locations for RLQ, LLQ, and suprapubic ports were noted by transillumination of the abdominal wall.  0.25% marcaine was used to anesthetize the skin.  83mm skin incision was made in the RLQ and an AirSeal port was placed underdirect visualization of the laparoscope.  Then a  17mm skin incision was made and a 8mm nonbladed trochar and port was placed in the LLQ.  Finally, and 22mm skin incision was made about 4cm above the pubic symphasis and an 41mm non-bladed port was placed with direct visualization of the laparoscope.  All trochars were removed.    Ureters were identified.  Attention was turned to the left side.  With uterus on stretch the left tube was excised off the ovary and mesosalpinx was dissected to free the tube. Then the left utero-ovarian pedicle was serially clamped cauterized and incised using the ligasure device. Left round ligament was serially clamped cauterized and incised. The anterior and posterior peritoneum of the inferior leaf of the broad ligament were opened. The beginning of the bladder flap was created.  The bladder was taken down below the level of the KOH ring. The left uterine artery skeletonized and then just superior to the KOH ring this vessel was serially clamped, cauterized, and incised.  Attention was turned the right side.  The uterus was placed on stretch to the opposite side.  The tube was excised off the ovary using sharp dissection a bipolar cautery.  The mesosalpinx was incised freeing the tube. Then the right uterine ovarian pedicle was serially clamped cauterized and incised. Next the right round ligament was serially clamped cauterized and incised. The anterior posterior peritoneum of the inferiorly for the broad ligament were opened. The anterior peritoneum was carried across to the dissection on the left side. The remainder of the bladder flap was created using sharp dissection. The bladder was well below the level of the KOH ring. The left uterine artery skeletonized. Then the left uterine artery, above the level of the KOH ring, was serially clamped cauterized and incised. The uterus was devascularized at this point.  The colpotomy was performed a starting in the midline and using a harmonic scalpel with the inferior edge of the open blade  This was carried around a circumferential fashion until the vaginal mucosa was completely incised in the specimen was freed.  The specimen was then delivered to the vagina.  A vaginal occlusive device was used to maintain the pneumoperitoneum  Instruments were changed with a needle driver and Kobra graspers.  Using a 9 inch V. lock  suture, the cuff was closed by incorporating the anterior and posterior vaginal mucosa in each stitch. This was carried across all the way to the left corner and a running fashion. Two stitches were brought back towards the midline and the suture was cut flush with the vagina. The needle was brought out the pelvis. The pelvis was irrigated. All pedicles were inspected. No bleeding was noted.   Co2 pressures were lowered to 68mm Hg.  Again, no bleeding was noted.  Ureters were noted deep in the pelvis to be peristalsing.  Arista was placed along the pedicles.  At this point the procedure was completed.  The remaining instruments were removed.  The ports (except the suprapubic port) were removed under direct visualization of the laparoscope and the pneumoperitoneum was relieved.  The patient was taken out of Trendelenburg positioning.  Several deep breaths were given to the patient's trying to any gas the abdomen and finally the suprapubic port was removed.  The skin was then closed with subcuticular stitches of 3-0 Vicryl. The skin was cleansed Dermabond was applied. Attention was then turned the vagina and the cuff was inspected. No bleeding was noted. The anterior posterior vaginal mucosa was incorporated in each stitch. The Foley catheter  was removed.  Cystoscopy was performed.  No sutures or bladder injuries were noted.  Ureters were noted with normal urine jets from each one was seen.  Foley was left out after the cystoscopic fluid was drained and cystoscope removed.  Sponge, lap, needle, initially counts were correct x2. Patient tolerated the procedure very well. She was awakened from anesthesia, extubated and taken to recovery in stable condition.    COUNTS:  YES  PLAN OF CARE: Transfer to PACU

## 2022-05-13 NOTE — Discharge Instructions (Signed)
Post Op Hysterectomy Instructions Please read the instructions below. Refer to these instructions for the next few weeks. These instructions provide you with general information on caring for yourself after surgery. Your caregiver may also give you specific instructions. While your treatment has been planned according to the most current medical practices available, unavoidable problems sometimes happen. If you have any problems or questions after you leave, please call your caregiver.  HOME CARE INSTRUCTIONS Healing will take time. You will have discomfort, tenderness, swelling and bruising at the operative site for a couple of weeks. This is normal and will get better as time goes on.  Only take over-the-counter or prescription medicines for pain, discomfort or fever as directed by your caregiver.  Do not take aspirin. It can cause bleeding.  Do not drive when taking pain medication.  Follow your caregiver's advice regarding diet, exercise, lifting, driving and general activities.  Resume your usual diet as directed and allowed.  Get plenty of rest and sleep.  Do not douche, use tampons, or have sexual intercourse until your caregiver gives you permission. .  Take your temperature if you feel hot or flushed.  You may shower today when you get home.  No tub bath for one week.   Do not drink alcohol until you are not taking any narcotic pain medications.  Try to have someone home with you for a week or two to help with the household activities.   Be careful over the next two to three weeks with any activities at home that involve lifting, pushing, or pulling.  Listen to your body--if something feels uncomfortable to do, then don't do it. Make sure you and your family understands everything about your operation and recovery.  Walking up stairs is fine. Do not sign any legal documents until you feel normal again.  Keep all your follow-up appointments as recommended by your caregiver.   PLEASE CALL  THE OFFICE IF: There is swelling, redness or increasing pain in the wound area.  Pus is coming from the wound.  You notice a bad smell from the wound or surgical dressing.  You have pain, redness and swelling from the intravenous site.  The wound is breaking open (the edges are not staying together).   You develop pain or bleeding when you urinate.  You develop abnormal vaginal discharge.  You have any type of abnormal reaction or develop an allergy to your medication.  You need stronger pain medication for your pain   SEEK IMMEDIATE MEDICAL CARE: You develop a temperature of 100.5 or higher.  You develop abdominal pain.  You develop chest pain.  You develop shortness of breath.  You pass out.  You develop pain, swelling or redness of your leg.  You develop heavy vaginal bleeding with or without blood clots.   MEDICATIONS: Restart your regular medications BUT wait one week before restarting all vitamins and mineral supplements Use Motrin 800mg every 8 hours for the next several days.  This will help you use less Percocet.  Use the Percocet 5/325 1-2 tabs every 4-6 hours as needed for pain. You may use an over the counter stool softener like Colace or Dulcolax to help with starting a bowel movement.  Start the day after you go home.  Warm liquids, fluids, and ambulation help too.  If you have not had a bowel movement in four days, you need to call the office.  

## 2022-05-14 ENCOUNTER — Encounter (HOSPITAL_BASED_OUTPATIENT_CLINIC_OR_DEPARTMENT_OTHER): Payer: Self-pay | Admitting: Obstetrics & Gynecology

## 2022-05-14 LAB — SURGICAL PATHOLOGY

## 2022-05-20 ENCOUNTER — Ambulatory Visit (INDEPENDENT_AMBULATORY_CARE_PROVIDER_SITE_OTHER): Payer: PRIVATE HEALTH INSURANCE | Admitting: Obstetrics & Gynecology

## 2022-05-20 ENCOUNTER — Encounter (HOSPITAL_BASED_OUTPATIENT_CLINIC_OR_DEPARTMENT_OTHER): Payer: Self-pay | Admitting: Obstetrics & Gynecology

## 2022-05-20 VITALS — BP 108/63 | HR 85 | Wt 238.0 lb

## 2022-05-20 DIAGNOSIS — N870 Mild cervical dysplasia: Secondary | ICD-10-CM

## 2022-05-20 DIAGNOSIS — Z9889 Other specified postprocedural states: Secondary | ICD-10-CM

## 2022-06-19 ENCOUNTER — Encounter (HOSPITAL_BASED_OUTPATIENT_CLINIC_OR_DEPARTMENT_OTHER): Payer: Self-pay | Admitting: Obstetrics & Gynecology

## 2022-06-19 ENCOUNTER — Encounter: Payer: Self-pay | Admitting: Sports Medicine

## 2022-06-19 NOTE — Telephone Encounter (Signed)
We can not update patient's name by insurance card. We have to have an updated photo ID, will call patient and inform her on Policy. AMUCK

## 2022-06-19 NOTE — Telephone Encounter (Signed)
Scanned photo Id into chart and changed name for patient. Samantha Bird

## 2022-06-19 NOTE — Telephone Encounter (Signed)
Patient sent photo ID.

## 2022-06-19 NOTE — Telephone Encounter (Signed)
Left patient voicemail explaining we have to have a copy of name change on Photo ID to change name in Chart. If any questions she can call us back, however I did print off her NEW insurance card and scan into chart. AMUCK

## 2022-06-19 NOTE — Telephone Encounter (Signed)
See insurance, wants name updated

## 2022-06-23 ENCOUNTER — Encounter (HOSPITAL_BASED_OUTPATIENT_CLINIC_OR_DEPARTMENT_OTHER): Payer: PRIVATE HEALTH INSURANCE | Admitting: Obstetrics & Gynecology

## 2022-06-24 ENCOUNTER — Ambulatory Visit (INDEPENDENT_AMBULATORY_CARE_PROVIDER_SITE_OTHER): Payer: PRIVATE HEALTH INSURANCE | Admitting: Obstetrics & Gynecology

## 2022-06-24 ENCOUNTER — Encounter (HOSPITAL_BASED_OUTPATIENT_CLINIC_OR_DEPARTMENT_OTHER): Payer: Self-pay | Admitting: Obstetrics & Gynecology

## 2022-06-24 VITALS — BP 110/60 | HR 70 | Ht 68.0 in | Wt 239.8 lb

## 2022-06-24 DIAGNOSIS — Z9889 Other specified postprocedural states: Secondary | ICD-10-CM

## 2022-06-24 DIAGNOSIS — N879 Dysplasia of cervix uteri, unspecified: Secondary | ICD-10-CM

## 2022-06-24 NOTE — Progress Notes (Signed)
GYNECOLOGY  VISIT  CC:   post op recheck  HPI: 51 y.o. G0P0000 Married White or Caucasian female here for recheck after undergoing TLH/bilateral salpingectomy/cystoscopy on 05/23/2022.  She reports bleeding is none.  She has no pain.  Bowel function is Normal.  Bladder function is normal.     MEDS:   Current Outpatient Medications on File Prior to Visit  Medication Sig Dispense Refill   Apremilast 30 MG TABS Take by mouth 2 (two) times daily.     Ascorbic Acid (VITAMIN C PO) Take by mouth.     clobetasol ointment (TEMOVATE) 0.05 % 2 (two) times daily as needed.     cyclobenzaprine (FLEXERIL) 10 MG tablet Take 1 tablet (10 mg total) by mouth at bedtime. (Patient taking differently: Take 10 mg by mouth at bedtime as needed.) 90 tablet 3   fluticasone (FLONASE) 50 MCG/ACT nasal spray Place into both nostrils daily.     ibuprofen (ADVIL) 800 MG tablet Take 1 tablet (800 mg total) by mouth every 8 (eight) hours as needed. 30 tablet 0   metoprolol tartrate (LOPRESSOR) 50 MG tablet Take 50 mg by mouth 2 (two) times daily.     Multiple Vitamins-Minerals (MULTIVITAMIN PO) Take by mouth as needed.     naproxen (NAPROSYN) 500 MG tablet Take 1 tablet (500 mg total) by mouth 2 (two) times daily with a meal. Don't take while you are taking the Motrin. 60 tablet 3   ondansetron (ZOFRAN) 4 MG tablet Take 1 tablet (4 mg total) by mouth every 8 (eight) hours as needed for nausea or vomiting. 20 tablet 0   Probiotic Product (PROBIOTIC PO) Take by mouth.     No current facility-administered medications on file prior to visit.    SH:  Smoking No    PHYSICAL EXAMINATION:    BP 110/60 (BP Location: Left Arm, Patient Position: Sitting, Cuff Size: Large)   Pulse 70   Ht 5\' 8"  (1.727 m) Comment: reported  Wt 239 lb 12.8 oz (108.8 kg)   BMI 36.46 kg/m     General appearance: alert, cooperative and appears stated age CV:  Regular rate and rhythm Lungs:  clear to auscultation, no wheezes, rales or rhonchi,  symmetric air entry Abdomen: soft, non-tender; bowel sounds normal; no masses,  no organomegaly Incisions:  C/D/I  Pelvic: External genitalia:  no lesions              Urethra:  normal appearing urethra with no masses, tenderness or lesions              Bartholins and Skenes: normal                 Vagina: normal appearing vagina with normal color and discharge, no lesions              Cervix: absent              Bimanual Exam:  Uterus:  uterus absent              Adnexa: no mass, fullness, tenderness  Chaperone, , CMA, was present for exam.  Assessment/Plan: 1. Post-operative state - Doing well.  Recheck 1 year.  2. Cervical dysplasia - Discussed doing pap and HR HPV in 1 year.

## 2022-09-25 ENCOUNTER — Ambulatory Visit (HOSPITAL_BASED_OUTPATIENT_CLINIC_OR_DEPARTMENT_OTHER): Payer: No Typology Code available for payment source | Admitting: Obstetrics & Gynecology

## 2022-10-07 ENCOUNTER — Encounter: Payer: Self-pay | Admitting: Sports Medicine

## 2022-10-07 ENCOUNTER — Ambulatory Visit: Payer: PRIVATE HEALTH INSURANCE | Admitting: Sports Medicine

## 2022-10-07 ENCOUNTER — Ambulatory Visit (INDEPENDENT_AMBULATORY_CARE_PROVIDER_SITE_OTHER): Payer: PRIVATE HEALTH INSURANCE

## 2022-10-07 DIAGNOSIS — M7711 Lateral epicondylitis, right elbow: Secondary | ICD-10-CM | POA: Diagnosis not present

## 2022-10-07 NOTE — Assessment & Plan Note (Signed)
Pleasant 51 year old female, chronic lateral epicondylitis right side, injected in mid 2022, also injected in March 2023 with recurrence of pain. We did discuss PRP percutaneous tenotomy, she is in severe pain so we will do another steroid injection today with the understanding that the next injection if need be will be with PRP. Return to see me as needed.

## 2022-10-07 NOTE — Progress Notes (Signed)
    Procedures performed today:    Procedure: Real-time Ultrasound Guided injection of the right common extensor tendon origin Device: Samsung HS60  Verbal informed consent obtained.  Time-out conducted.  Noted no overlying erythema, induration, or other signs of local infection.  Skin prepped in a sterile fashion.  Local anesthesia: Topical Ethyl chloride.  With sterile technique and under real time ultrasound guidance: Noted extensor tendinopathy at the lateral epicondyle, 1 cc Kenalog 40, 1 cc lidocaine, 1 cc bupivacaine injected easily Completed without difficulty  Advised to call if fevers/chills, erythema, induration, drainage, or persistent bleeding.  Images permanently stored and available for review in PACS.  Impression: Technically successful ultrasound guided injection.  Independent interpretation of notes and tests performed by another provider:   None.  Brief History, Exam, Impression, and Recommendations:    Lateral epicondylitis, right elbow Pleasant 51 year old female, chronic lateral epicondylitis right side, injected in mid 2022, also injected in March 2023 with recurrence of pain. We did discuss PRP percutaneous tenotomy, she is in severe pain so we will do another steroid injection today with the understanding that the next injection if need be will be with PRP. Return to see me as needed.    ____________________________________________ Ihor Austin. Benjamin Stain, M.D., ABFM., CAQSM., AME. Primary Care and Sports Medicine [facility]  Adjunct Professor of Family Medicine  [place] [employer] [facility] [employer]  Restaurant manager, fast food

## 2022-10-07 NOTE — Patient Instructions (Signed)
 Platelet-rich plasma is used in musculoskeletal medicine to focus your own body's ability to heal. It has several well-done published randomized control trials (RCT) which demonstrate both its effectiveness and safety in many musculoskeletal conditions, including osteoarthritis, tendinopathies, and damaged vertebral discs. PRP has been in clinical use since the 1990's. Many people know that platelets form a clot if there is a cut in the skin. It turns out that platelets do not only form a clot, they also start the body's own repair process. When platelets activate to form a clot, they also release alpha granules which have hundreds of chemical messengers in them that initiate and organize repair to the damaged tissue. Precisely placing PRP into the site of injury will initiate the healing process by activating on the damaged cartilage or tendon. This is an inflammatory process, and inflammation is the vital first phase of Healing.  What to expect and how to prepare for PRP   2 weeks prior to the procedure: depending on the procedure, you may need to arrange for a driver to bring you home. IF you are having a lower extremity procedure, we can provide crutches as needed.   7 days prior to the procedure: Stop taking anti-inflammatory drugs like ibuprofen, Naprosyn, Celebrex, or Meloxicam. Let Dr. Swara Donze know if you have been taking prednisone or other corticosteroids in the last month.   The day before the procedure: thoroughly shower and clean your skin.    The day of the procedure: Wear loose-fitting clothing like sweatpants or shorts. If you are having an upper body procedure wear a top that can button or zip up.  PRP will initiate healing and a productive inflammation, and PRP therapy will make the body part treated sore for 4 days to two weeks. Anti-inflammatory drugs (i.e. ibuprofen, Naprosyn, Celebrex) and corticosteroids such as prednisone can blunt or stop this process,  so it is important to not take any anti-inflammatory drugs for 7 days before getting PRP therapy, or for at least three weeks after PRP therapy. Corticosteroid injections can blunt inflammation for 30 days, so let us know if you have had one recently. Depending on the body part injected, you may be in a sling or on crutches for several days. Just like wringing out a wet dishcloth, if you load or tense a tendon or ligament that has just been injected with PRP, some of the PRP injected will squish out. By keeping the body part treated relaxed by using a sling (for the shoulder or arm) or crutches (for hips and legs) for a few days, the PRP can bind in place and do its job.   You may need a driver to bring you home.  Tobacco/nicotine is a potent toxin and its use constricts small blood vessels which are needed for tissue repair.  Tobacco/nicotine use will limit the effectiveness of any treatment and stopping tobacco use is one of the single  greatest actions you can take to improve your health. Avoid toxins like alcohol, which inhibits and depresses the cells needed for tissue repair.  What happens during the PRP procedure?  Platelet rich plasma is made by taking some of your blood and performing a two-stage centrifuge process on it to concentrate the PRP. First, your blood is drawn into a syringe with a small amount of anti-coagulant in it (this is to keep the blood from clotting during this process). The amount of blood drawn is usually about 10-30 milliliters, depending on how much PRP is needed for   the treatment.  (There are 355 milliliters in a 12-ounce soda can for comparison).  Then the blood is transferred in a sterile fashion into a centrifuge tube. It is then centrifuged for the first cycle where the red blood cells are isolated and discarded. In the second centrifuge cycle, the platelet-rich fraction of the remaining plasma is concentrated and placed in a syringe. The skin at the  injection site is numbed with a small amount of topical cooling spray. Dr Delene Morais will then precisely inject the PRP into the injury site using ultrasound guidance.  What to do after your procedure  I will give you specific medicine to control any discomfort you may have after the procedure. Avoid NSAIDs like ibuprofen. Acetaminophen can be used for mild pain.  Depending on the part of the body treated, usually you will be placed in a sling or on crutches for 1 to 3 days. Do your best not to tense or load the treated area during this time. After 3 days, unless otherwise instructed, the treated body part should be used and slowly moved through its full range of motion. It will be sore, but you will not be doing damage by moving it, in fact it needs to move to heal. If you were on crutches for a period of time, walking is ok once you are off the crutches. For now, avoid activities that specifically hurt you before being treated. Exercise is vital to good health and finding a way to cross train around your injury is important not only for your physical health, but for your mental health as well. Ask me about cross training options for your injury. Some brief (10 minutes or less) period of heat or ice therapy will not hurt the therapy, but it is not required. Usually, depending on the initial injury, physical therapy is started from two weeks to four weeks after injection. Improvements in pain and function should be expected from 8 weeks to 12 weeks after injection and some injuries may require more than one treatment.    ____________________________________________ Samantha Bird, M.D., ABFM., CAQSM., AME. Primary Care and Sports Medicine [facility]  Adjunct Professor of Family Medicine  [employer]  [employer] Medical Examiner  

## 2022-12-10 ENCOUNTER — Other Ambulatory Visit: Payer: Self-pay | Admitting: Obstetrics & Gynecology

## 2022-12-10 DIAGNOSIS — Z1231 Encounter for screening mammogram for malignant neoplasm of breast: Secondary | ICD-10-CM

## 2023-04-30 ENCOUNTER — Other Ambulatory Visit: Payer: Self-pay | Admitting: Sports Medicine

## 2023-04-30 DIAGNOSIS — M47816 Spondylosis without myelopathy or radiculopathy, lumbar region: Secondary | ICD-10-CM

## 2023-05-01 ENCOUNTER — Ambulatory Visit: Payer: PRIVATE HEALTH INSURANCE | Admitting: Sports Medicine

## 2023-05-08 ENCOUNTER — Ambulatory Visit: Payer: PRIVATE HEALTH INSURANCE | Admitting: Sports Medicine

## 2023-05-08 DIAGNOSIS — M47816 Spondylosis without myelopathy or radiculopathy, lumbar region: Secondary | ICD-10-CM | POA: Diagnosis not present

## 2023-05-08 DIAGNOSIS — M7711 Lateral epicondylitis, right elbow: Secondary | ICD-10-CM | POA: Diagnosis not present

## 2023-05-08 MED ORDER — TRAMADOL HCL 50 MG PO TABS: 50.0000 mg | ORAL_TABLET | Freq: Three times a day (TID) | ORAL | 0 refills | Status: DC | PRN

## 2023-05-08 MED ORDER — NAPROXEN 500 MG PO TABS: 500.0000 mg | ORAL_TABLET | Freq: Two times a day (BID) | ORAL | 3 refills | Status: DC

## 2023-05-08 MED ORDER — CYCLOBENZAPRINE HCL 10 MG PO TABS: ORAL_TABLET | ORAL | 3 refills | Status: DC

## 2023-05-08 NOTE — Patient Instructions (Signed)
 Platelet-rich plasma is used in musculoskeletal medicine to focus your own body's ability to heal. It has several well-done published randomized control trials (RCT) which demonstrate both its effectiveness and safety in many musculoskeletal conditions, including osteoarthritis, tendinopathies, and damaged vertebral discs. PRP has been in clinical use since the 1990's. Many people know that platelets form a clot if there is a cut in the skin. It turns out that platelets do not only form a clot, they also start the body's own repair process. When platelets activate to form a clot, they also release alpha granules which have hundreds of chemical messengers in them that initiate and organize repair to the damaged tissue. Precisely placing PRP into the site of injury will initiate the healing process by activating on the damaged cartilage or tendon. This is an inflammatory process, and inflammation is the vital first phase of Healing.  What to expect and how to prepare for PRP   2 weeks prior to the procedure: depending on the procedure, you may need to arrange for a driver to bring you home. IF you are having a lower extremity procedure, we can provide crutches as needed.   7 days prior to the procedure: Stop taking anti-inflammatory drugs like ibuprofen, Naprosyn, Celebrex, or Meloxicam. Let Dr. Leniya Breit know if you have been taking prednisone or other corticosteroids in the last month.   The day before the procedure: thoroughly shower and clean your skin.    The day of the procedure: Wear loose-fitting clothing like sweatpants or shorts. If you are having an upper body procedure wear a top that can button or zip up.  PRP will initiate healing and a productive inflammation, and PRP therapy will make the body part treated sore for 4 days to two weeks. Anti-inflammatory drugs (i.e. ibuprofen, Naprosyn, Celebrex) and corticosteroids such as prednisone can blunt or stop this process,  so it is important to not take any anti-inflammatory drugs for 7 days before getting PRP therapy, or for at least three weeks after PRP therapy. Corticosteroid injections can blunt inflammation for 30 days, so let us know if you have had one recently. Depending on the body part injected, you may be in a sling or on crutches for several days. Just like wringing out a wet dishcloth, if you load or tense a tendon or ligament that has just been injected with PRP, some of the PRP injected will squish out. By keeping the body part treated relaxed by using a sling (for the shoulder or arm) or crutches (for hips and legs) for a few days, the PRP can bind in place and do its job.   You may need a driver to bring you home.  Tobacco/nicotine is a potent toxin and its use constricts small blood vessels which are needed for tissue repair.  Tobacco/nicotine use will limit the effectiveness of any treatment and stopping tobacco use is one of the single  greatest actions you can take to improve your health. Avoid toxins like alcohol, which inhibits and depresses the cells needed for tissue repair.  What happens during the PRP procedure?  Platelet rich plasma is made by taking some of your blood and performing a two-stage centrifuge process on it to concentrate the PRP. First, your blood is drawn into a syringe with a small amount of anti-coagulant in it (this is to keep the blood from clotting during this process). The amount of blood drawn is usually about 10-30 milliliters, depending on how much PRP is needed for   the treatment.  (There are 355 milliliters in a 12-ounce soda can for comparison).  Then the blood is transferred in a sterile fashion into a centrifuge tube. It is then centrifuged for the first cycle where the red blood cells are isolated and discarded. In the second centrifuge cycle, the platelet-rich fraction of the remaining plasma is concentrated and placed in a syringe. The skin at the  injection site is numbed with a small amount of topical cooling spray. Dr Kimora Stankovic will then precisely inject the PRP into the injury site using ultrasound guidance.  What to do after your procedure  I will give you specific medicine to control any discomfort you may have after the procedure. Avoid NSAIDs like ibuprofen. Acetaminophen can be used for mild pain.  Depending on the part of the body treated, usually you will be placed in a sling or on crutches for 1 to 3 days. Do your best not to tense or load the treated area during this time. After 3 days, unless otherwise instructed, the treated body part should be used and slowly moved through its full range of motion. It will be sore, but you will not be doing damage by moving it, in fact it needs to move to heal. If you were on crutches for a period of time, walking is ok once you are off the crutches. For now, avoid activities that specifically hurt you before being treated. Exercise is vital to good health and finding a way to cross train around your injury is important not only for your physical health, but for your mental health as well. Ask me about cross training options for your injury. Some brief (10 minutes or less) period of heat or ice therapy will not hurt the therapy, but it is not required. Usually, depending on the initial injury, physical therapy is started from two weeks to four weeks after injection. Improvements in pain and function should be expected from 8 weeks to 12 weeks after injection and some injuries may require more than one treatment.    ____________________________________________ Samantha Bird Samantha Bird, M.D., ABFM., CAQSM., AME. Primary Care and Sports Medicine [facility]  Adjunct Professor of Family Medicine  [employer]  [employer] Aviation Medical Examiner  

## 2023-05-08 NOTE — Progress Notes (Signed)
    Procedures performed today:    None.  Independent interpretation of notes and tests performed by another provider:   None.  Brief History, Exam, Impression, and Recommendations:    Lateral epicondylitis, right elbow Samantha Bird is a very pleasant 52 year old female, she has chronic right lateral epicondylitis, she had an injection in May 2022, she also had an injection March 2023, all with recurrence, we did discuss the need to avoid steroid injections and proceed more with a PRP percutaneous tenotomy, we did a steroid injection in November 2023, she did go see a Careers adviser who suggested extensor tendon release. She would like to consider PRP percutaneous to the first, she will schedule it next month. We can do tramadol for pain during the week preceding that she is unable to take NSAIDs, and she does understand she will need high-dose narcotics for several days after the procedure.  I spent 30 minutes of total time managing this patient today, this includes chart review, face to face, and non-face to face time.  ____________________________________________ Ihor Austin. Benjamin Stain, M.D., ABFM., CAQSM., AME. Primary Care and Sports Medicine [facility]  Adjunct Professor of Family Medicine  [place] [employer]  Restaurant manager, fast food

## 2023-05-08 NOTE — Assessment & Plan Note (Signed)
Samantha Bird is a very pleasant 52 year old female, she has chronic right lateral epicondylitis, she had an injection in May 2022, she also had an injection March 2023, all with recurrence, we did discuss the need to avoid steroid injections and proceed more with a PRP percutaneous tenotomy, we did a steroid injection in November 2023, she did go see a Careers adviser who suggested extensor tendon release. She would like to consider PRP percutaneous to the first, she will schedule it next month. We can do tramadol for pain during the week preceding that she is unable to take NSAIDs, and she does understand she will need high-dose narcotics for several days after the procedure.

## 2023-06-01 ENCOUNTER — Encounter (INDEPENDENT_AMBULATORY_CARE_PROVIDER_SITE_OTHER): Payer: PRIVATE HEALTH INSURANCE | Admitting: Sports Medicine

## 2023-06-01 DIAGNOSIS — M7711 Lateral epicondylitis, right elbow: Secondary | ICD-10-CM

## 2023-06-01 MED ORDER — HYDROCODONE-ACETAMINOPHEN 10-325 MG PO TABS: 1.0000 | ORAL_TABLET | Freq: Three times a day (TID) | ORAL | 0 refills | Status: DC | PRN

## 2023-06-01 NOTE — Telephone Encounter (Signed)
I spent 5 total minutes of online digital evaluation and management services in this patient-initiated request for online care. 

## 2023-06-10 MED ORDER — TRIAZOLAM 0.25 MG PO TABS: ORAL_TABLET | ORAL | 0 refills | Status: DC

## 2023-06-10 NOTE — Addendum Note (Signed)
Addended by: Monica Becton on: 06/10/2023 11:16 AM   Modules accepted: Orders

## 2023-06-11 ENCOUNTER — Other Ambulatory Visit (INDEPENDENT_AMBULATORY_CARE_PROVIDER_SITE_OTHER): Payer: PRIVATE HEALTH INSURANCE

## 2023-06-11 ENCOUNTER — Ambulatory Visit: Payer: PRIVATE HEALTH INSURANCE | Admitting: Sports Medicine

## 2023-06-11 DIAGNOSIS — M7711 Lateral epicondylitis, right elbow: Secondary | ICD-10-CM | POA: Diagnosis not present

## 2023-06-11 NOTE — Assessment & Plan Note (Signed)
This is a very pleasant 52 year old female, she has chronic right lateral epicondylitis, she had a steroid injection May 2022, also had an injection March 2023 all with recurrence of pain. We last did a steroid injection per her request November 2023, we referred her to orthopedic surgery who suggested extensor tendon release, she would like to consider PRP percutaneous tenotomy, we will do this today, return to see me in a couple weeks to reevaluate pain, she understands it can take 8+ weeks to get full improvement.

## 2023-06-11 NOTE — Progress Notes (Signed)
    Procedures performed today:    Procedure: Real-time Ultrasound Guided Platelet Rich Plasma (PRP) Injection of right common extensor tendon origin Device: Samsung HS60  Verbal informed consent obtained.  Time-out conducted.  Noted no overlying erythema, induration, or other signs of local infection.  Obtained 30 cc of blood from peripheral vein, using the "PEAK" centrifuge, red blood cells were separated from the plasma. Subsequently red blood cells were drained leaving only plasma with the buffy coat layer between the desired lines. Platelet poor plasma was then centrifuged out, and remaining platelet rich plasma aspirated into a 5 cc syringe.  Skin prepped in a sterile fashion.  Local anesthesia: Topical Ethyl chloride.  With sterile technique and under real time ultrasound guidance the platelet rich plasma (PRP) obtained above: Noted, extensor tendon tearing, 1 cc lidocaine, 1 cc bupivacaine injected superficial to and deep to the common extensor tendon at the origin, syringe switched and PRP injected in and through the tendon for a total of approximately 50 passes. Completed without difficulty  Advised to call if fevers/chills, erythema, induration, drainage, or persistent bleeding.  Images permanently stored and available for review in PACS.  Impression: Technically successful ultrasound guided Platelet Rich Plasma (PRP) injection.  Independent interpretation of notes and tests performed by another provider:   None.  Brief History, Exam, Impression, and Recommendations:    Lateral epicondylitis, right elbow This is a very pleasant 52 year old female, she has chronic right lateral epicondylitis, she had a steroid injection May 2022, also had an injection March 2023 all with recurrence of pain. We last did a steroid injection per her request November 2023, we referred her to orthopedic surgery who suggested extensor tendon release, she would like to consider PRP percutaneous tenotomy,  we will do this today, return to see me in a couple weeks to reevaluate pain, she understands it can take 8+ weeks to get full improvement.    ____________________________________________ Ihor Austin. Benjamin Stain, M.D., ABFM., CAQSM., AME. Primary Care and Sports Medicine [facility]  Adjunct Professor of Family Medicine  [place] [employer] [facility]  Restaurant manager, fast food

## 2023-06-12 ENCOUNTER — Encounter: Payer: Self-pay | Admitting: Sports Medicine

## 2023-06-12 DIAGNOSIS — M7711 Lateral epicondylitis, right elbow: Secondary | ICD-10-CM

## 2023-06-12 MED ORDER — HYDROCODONE-ACETAMINOPHEN 10-325 MG PO TABS: 1.0000 | ORAL_TABLET | Freq: Three times a day (TID) | ORAL | 0 refills | Status: DC | PRN

## 2023-06-22 ENCOUNTER — Ambulatory Visit (HOSPITAL_BASED_OUTPATIENT_CLINIC_OR_DEPARTMENT_OTHER): Payer: PRIVATE HEALTH INSURANCE | Admitting: Obstetrics & Gynecology

## 2023-06-26 ENCOUNTER — Ambulatory Visit: Payer: PRIVATE HEALTH INSURANCE | Admitting: Sports Medicine

## 2023-06-26 DIAGNOSIS — M7711 Lateral epicondylitis, right elbow: Secondary | ICD-10-CM

## 2023-06-26 MED ORDER — TRAMADOL HCL 50 MG PO TABS: 50.0000 mg | ORAL_TABLET | Freq: Three times a day (TID) | ORAL | 0 refills | Status: DC | PRN

## 2023-06-26 NOTE — Assessment & Plan Note (Signed)
This is a very 52 year old female, she has chronic right lateral condyle lightest, she had a few steroid injections, 2 weeks ago we did PRP percutaneous tenotomy, she had the expected postoperative pain, she is doing a lot better now, I will refill her tramadol for use as needed, I still would like her to avoid NSAIDs for at least another 2 to 4 weeks. She can restart some of the home conditioning and return to see me in 6 weeks, she does understand this can take a total of 12 weeks often to get full improvement.

## 2023-06-26 NOTE — Progress Notes (Signed)
    Procedures performed today:    None.  Independent interpretation of notes and tests performed by another provider:   None.  Brief History, Exam, Impression, and Recommendations:    Lateral epicondylitis, right elbow This is a very 52 year old female, she has chronic right lateral condyle lightest, she had a few steroid injections, 2 weeks ago we did PRP percutaneous tenotomy, she had the expected postoperative pain, she is doing a lot better now, I will refill her tramadol for use as needed, I still would like her to avoid NSAIDs for at least another 2 to 4 weeks. She can restart some of the home conditioning and return to see me in 6 weeks, she does understand this can take a total of 12 weeks often to get full improvement.    ____________________________________________ Ihor Austin. Benjamin Stain, M.D., ABFM., CAQSM., AME. Primary Care and Sports Medicine [facility]  Adjunct Professor of Family Medicine  [place] [employer] [facility] [employer] [facility] [employer]  Restaurant manager, fast food

## 2023-07-21 ENCOUNTER — Other Ambulatory Visit (HOSPITAL_COMMUNITY)
Admission: RE | Admit: 2023-07-21 | Discharge: 2023-07-21 | Disposition: A | Discharge status: Home / Self Care | Payer: PRIVATE HEALTH INSURANCE | Source: Ambulatory Visit | Attending: Obstetrics & Gynecology | Admitting: Obstetrics & Gynecology

## 2023-07-21 ENCOUNTER — Encounter (HOSPITAL_BASED_OUTPATIENT_CLINIC_OR_DEPARTMENT_OTHER): Payer: Self-pay | Admitting: Obstetrics & Gynecology

## 2023-07-21 ENCOUNTER — Ambulatory Visit (HOSPITAL_BASED_OUTPATIENT_CLINIC_OR_DEPARTMENT_OTHER): Payer: PRIVATE HEALTH INSURANCE | Admitting: Obstetrics & Gynecology

## 2023-07-21 VITALS — BP 108/66 | HR 75 | Ht 68.0 in | Wt 210.6 lb

## 2023-07-21 DIAGNOSIS — Z01419 Encounter for gynecological examination (general) (routine) without abnormal findings: Secondary | ICD-10-CM | POA: Diagnosis not present

## 2023-07-21 DIAGNOSIS — Z124 Encounter for screening for malignant neoplasm of cervix: Secondary | ICD-10-CM

## 2023-07-21 DIAGNOSIS — Z9889 Other specified postprocedural states: Secondary | ICD-10-CM

## 2023-07-21 DIAGNOSIS — N870 Mild cervical dysplasia: Secondary | ICD-10-CM

## 2023-07-21 DIAGNOSIS — N898 Other specified noninflammatory disorders of vagina: Secondary | ICD-10-CM | POA: Diagnosis not present

## 2023-07-21 DIAGNOSIS — N941 Unspecified dyspareunia: Secondary | ICD-10-CM

## 2023-07-21 MED ORDER — ESTRADIOL 0.1 MG/GM VA CREA: TOPICAL_CREAM | VAGINAL | 3 refills | Status: AC

## 2023-07-21 NOTE — Progress Notes (Signed)
52 y.o. G0P0000 Married White or Caucasian female here for annual exam.  Doing well.  Is on wegovy and exercising really regularly.  Doing a part time job with [employer].  This has been great for her.    Denies vaginal bleeding.  Pap screening discussed.  She has had some issues with tightness and pain with intercourse.  Having some hot flashes and night sweats. Doesn't feel hot flashes are severe enough to need treatment.             Sexually active: Yes.    The current method of family planning is status post hysterectomy.    Exercising: Yes.     pilates Smoker:  no  Health Maintenance: Pap:  09/23/2021 History of abnormal Pap:  yes MMG:  01/10/2022 Colonoscopy:  07/20/2019, follow up 10 years BMD:   not indicated Screening Labs: 11/2022   reports that she has quit smoking. Her smoking use included cigarettes. She has never used smokeless tobacco. She reports that she does not currently use alcohol. She reports that she does not use drugs.  Past Medical History:  Diagnosis Date   Abnormal Pap smear 2013   neg pap +HPV HR, 16/18 genotype neg, 12/2018 LGSIL HPV HR+   Anxiety    only situational   Arthritis    back, degenerative disc   Condyloma    Daytime somnolence 2023   Patient follows with [facility] Pulmonology, LOV 02/14/22. 02/13/22 sleep study was negative.   Depression    only situational per pt on 05/05/22   DOE (dyspnea on exertion) 2023   Patient follows with [facility] Pulmonology, LOV 02/14/22 in Epic.   IBS (irritable bowel syndrome)    LBBB (left bundle branch block) 2019   Patient follows with cardiologist Dr. Sharol Roussel at [facility], LOV 03/12/22 in Epic. / Echocardiogram in Care Everywhere, LVEF 45 % on 04/28/2022. No change from previous echocardiogram. Per pt, she has a f/u appt on 06/12/22 with cardiologist.   Migraines    chronic,no aura, improved as of 05/05/22 per pt   PVCs (premature ventricular contractions) 04/04/2021   symptomatic PVCs  w/ DOE    Past Surgical History:  Procedure Laterality Date   AUGMENTATION MAMMAPLASTY Bilateral 2006   COLONOSCOPY  07/20/2019   COLPOSCOPY  05/30/2020   colposcopy w/ LEEP   CYSTOSCOPY N/A 05/13/2022   Procedure: CYSTOSCOPY;  Surgeon: Jerene Bears, MD;  Location: [facility];  Service: Gynecology;  Laterality: N/A;   INTRAUTERINE DEVICE (IUD) INSERTION     inserted in 06,removed, then new one inserted 4/11, 12-14-15 mirena removed & another inserted   IUD REMOVAL  05/13/2022   Procedure: INTRAUTERINE DEVICE (IUD) REMOVAL;  Surgeon: Jerene Bears, MD;  Location: [facility];  Service: Gynecology;;   TONSILLECTOMY AND ADENOIDECTOMY     TOTAL LAPAROSCOPIC HYSTERECTOMY WITH SALPINGECTOMY Bilateral 05/13/2022   Procedure: TOTAL LAPAROSCOPIC HYSTERECTOMY WITH BILATERAL SALPINGECTOMY;  Surgeon: Jerene Bears, MD;  Location: [facility];  Service: Gynecology;  Laterality: Bilateral;    Current Outpatient Medications  Medication Sig Dispense Refill   Ascorbic Acid (VITAMIN C PO) Take by mouth.     clobetasol ointment (TEMOVATE) 0.05 % 2 (two) times daily as needed.     cyclobenzaprine (FLEXERIL) 10 MG tablet One half to one tab PO qHS, then increase gradually to one tab TID. 90 tablet 3   estradiol (ESTRACE) 0.1 MG/GM vaginal cream 1 gram vaginally twice weekly 42.5 g  3   fluticasone (FLONASE) 50 MCG/ACT nasal spray Place into both nostrils daily.     metoprolol tartrate (LOPRESSOR) 50 MG tablet Take 50 mg by mouth 2 (two) times daily.     Multiple Vitamins-Minerals (MULTIVITAMIN PO) Take by mouth as needed.     Probiotic Product (PROBIOTIC PO) Take by mouth.     traMADol (ULTRAM) 50 MG tablet Take 1 tablet (50 mg total) by mouth every 8 (eight) hours as needed for moderate pain. 90 tablet 0   WEGOVY 2.4 MG/0.75ML SOAJ 2.4 mg.     No current facility-administered medications for this visit.    Family History  Problem Relation Age of Onset    Hypertension Brother    Diabetes Mother    Heart attack Father     ROS: Constitutional: negative Genitourinary:negative  Exam:   BP (!) 89/64 (BP Location: Right Arm, Patient Position: Sitting, Cuff Size: Large)   Pulse 75   Ht 5\' 8"  (1.727 m)   Wt 210 lb 9.6 oz (95.5 kg)   BMI 32.02 kg/m   Height: 5\' 8"  (172.7 cm)  General appearance: alert, cooperative and appears stated age Head: Normocephalic, without obvious abnormality, atraumatic Neck: no adenopathy, supple, symmetrical, trachea midline and thyroid normal to inspection and palpation Lungs: clear to auscultation bilaterally Breasts: normal appearance, no masses or tenderness Heart: regular rate and rhythm Abdomen: soft, non-tender; bowel sounds normal; no masses,  no organomegaly Extremities: extremities normal, atraumatic, no cyanosis or edema Skin: Skin color, texture, turgor normal. No rashes or lesions Lymph nodes: Cervical, supraclavicular, and axillary nodes normal. No abnormal inguinal nodes palpated Neurologic: Grossly normal   Pelvic: External genitalia:  no lesions              Urethra:  normal appearing urethra with no masses, tenderness or lesions              Bartholins and Skenes: normal                 Vagina: normal appearing vagina with normal color and no discharge, no lesions              Cervix: absent              Pap taken: Yes.   Bimanual Exam:  Uterus:  uterus absent              Adnexa: no mass, fullness, tenderness               Rectovaginal: Confirms               Anus:  normal sphincter tone, no lesions  Chaperone, Ina Homes, CMA, was present for exam.  Assessment/Plan: 1. Well woman exam with routine gynecological exam - Pap smear and HR HPV obtained today - Mammogram up today - Colonoscopy 07/20/2019  - Bone mineral density not indicated - lab work done 11/2022 - vaccines reviewed/updated  2. Cervical cancer screening - Cytology - PAP( [place])  3. Dysplasia of cervix,  low grade (CIN 1)  4. History of loop electrical excision procedure (LEEP)  5. Vaginal dryness - estradiol (ESTRACE) 0.1 MG/GM vaginal cream; 1 gram vaginally twice weekly  Dispense: 42.5 g; Refill: 3  6. Dyspareunia, female

## 2023-07-30 LAB — CYTOLOGY - PAP
Comment: NEGATIVE
Diagnosis: NEGATIVE
High risk HPV: NEGATIVE

## 2023-08-07 ENCOUNTER — Encounter: Payer: Self-pay | Admitting: Sports Medicine

## 2023-08-07 ENCOUNTER — Ambulatory Visit: Payer: PRIVATE HEALTH INSURANCE | Admitting: Sports Medicine

## 2023-08-07 DIAGNOSIS — M7711 Lateral epicondylitis, right elbow: Secondary | ICD-10-CM | POA: Diagnosis not present

## 2023-08-07 NOTE — Assessment & Plan Note (Signed)
27-month status post PRP percutaneous tenotomy right common extensor tendon origin, doing significantly better, I did advise her that we really need to wait a solid 12 weeks to determine full efficacy, she will continue her home conditioning, she can restart Pilates but needs to wear her counterforce brace and can return to see me 4 weeks as needed.

## 2023-08-07 NOTE — Progress Notes (Signed)
    Procedures performed today:    None.  Independent interpretation of notes and tests performed by another provider:   None.  Brief History, Exam, Impression, and Recommendations:    Lateral epicondylitis, right elbow 67-month status post PRP percutaneous tenotomy right common extensor tendon origin, doing significantly better, I did advise her that we really need to wait a solid 12 weeks to determine full efficacy, she will continue her home conditioning, she can restart Pilates but needs to wear her counterforce brace and can return to see me 4 weeks as needed.    ____________________________________________ Ihor Austin. Benjamin Stain, M.D., ABFM., CAQSM., AME. Primary Care and Sports Medicine [facility]  Adjunct Professor of Family Medicine  [place] [employer]  Restaurant manager, fast food

## 2023-11-02 ENCOUNTER — Encounter: Payer: Self-pay | Admitting: Sports Medicine

## 2023-11-02 DIAGNOSIS — M7711 Lateral epicondylitis, right elbow: Secondary | ICD-10-CM

## 2023-11-02 MED ORDER — TRAMADOL HCL 50 MG PO TABS: 50.0000 mg | ORAL_TABLET | Freq: Three times a day (TID) | ORAL | 0 refills | Status: DC | PRN

## 2024-02-17 ENCOUNTER — Encounter: Payer: Self-pay | Admitting: Sports Medicine

## 2024-02-17 DIAGNOSIS — M7711 Lateral epicondylitis, right elbow: Secondary | ICD-10-CM

## 2024-02-17 MED ORDER — TRAMADOL HCL 50 MG PO TABS: 50.0000 mg | ORAL_TABLET | Freq: Three times a day (TID) | ORAL | 0 refills | Status: DC | PRN

## 2024-02-17 NOTE — Telephone Encounter (Signed)
 Requesting rx rf of Tramadol Last written 11/02/2023 Last OV 08/07/2023 Upcoming appt =none Please see patient's attached note as well.

## 2024-03-21 ENCOUNTER — Other Ambulatory Visit: Payer: Self-pay | Admitting: Sports Medicine

## 2024-03-21 DIAGNOSIS — M7711 Lateral epicondylitis, right elbow: Secondary | ICD-10-CM

## 2024-04-24 ENCOUNTER — Other Ambulatory Visit: Payer: Self-pay | Admitting: Sports Medicine

## 2024-04-24 DIAGNOSIS — M7711 Lateral epicondylitis, right elbow: Secondary | ICD-10-CM

## 2024-04-29 ENCOUNTER — Other Ambulatory Visit: Payer: Self-pay | Admitting: Sports Medicine

## 2024-04-29 DIAGNOSIS — M7711 Lateral epicondylitis, right elbow: Secondary | ICD-10-CM

## 2024-05-30 ENCOUNTER — Encounter (HOSPITAL_BASED_OUTPATIENT_CLINIC_OR_DEPARTMENT_OTHER): Payer: Self-pay | Admitting: Obstetrics & Gynecology

## 2024-05-30 ENCOUNTER — Ambulatory Visit (INDEPENDENT_AMBULATORY_CARE_PROVIDER_SITE_OTHER): Payer: PRIVATE HEALTH INSURANCE | Admitting: Sports Medicine

## 2024-05-30 ENCOUNTER — Ambulatory Visit: Payer: PRIVATE HEALTH INSURANCE

## 2024-05-30 DIAGNOSIS — M7062 Trochanteric bursitis, left hip: Secondary | ICD-10-CM

## 2024-05-30 MED ORDER — PREDNISONE 50 MG PO TABS: ORAL_TABLET | ORAL | 0 refills | Status: DC

## 2024-05-30 MED ORDER — PANTOPRAZOLE SODIUM 40 MG PO TBEC
40.0000 mg | DELAYED_RELEASE_TABLET | Freq: Every day | ORAL | 3 refills | Status: AC
Start: 2024-05-30 — End: ?

## 2024-05-30 NOTE — Assessment & Plan Note (Signed)
 This is a pleasant 53 year old female, she does a lot of Pilates, for the past couple months she has had discomfort that she localizes lateral aspect left hip. Worse with flexion. On exam she has tenderness at the greater trochanter, she has weakness to the hip abductors on the left, she has negative FABER sign, negative FADIR sign, otherwise good hip motion and good strength. We discussed the anatomy and pathophysiology of trochanteric bursitis, she will work on aggressive hip abductor strengthening with physical therapy, we will do 5 days of prednisone , steroids and NSAIDs often hurt her stomach so we will do Protonix  for this 5 days as well. X-rays. Return to see me in 6 weeks.

## 2024-05-30 NOTE — Progress Notes (Signed)
    Procedures performed today:    None.  Independent interpretation of notes and tests performed by another provider:   None.  Brief History, Exam, Impression, and Recommendations:    Trochanteric bursitis, left hip This is a pleasant 53 year old female, she does a lot of Pilates, for the past couple months she has had discomfort that she localizes lateral aspect left hip. Worse with flexion. On exam she has tenderness at the greater trochanter, she has weakness to the hip abductors on the left, she has negative FABER sign, negative FADIR sign, otherwise good hip motion and good strength. We discussed the anatomy and pathophysiology of trochanteric bursitis, she will work on aggressive hip abductor strengthening with physical therapy, we will do 5 days of prednisone , steroids and NSAIDs often hurt her stomach so we will do Protonix  for this 5 days as well. X-rays. Return to see me in 6 weeks.    ____________________________________________ Debby PARAS. Curtis, M.D., ABFM., CAQSM., AME. Primary Care and Sports Medicine [facility]  Adjunct Professor of [facility] Medicine  [employer]  Restaurant manager, fast food

## 2024-06-01 ENCOUNTER — Ambulatory Visit: Payer: Self-pay | Admitting: Sports Medicine

## 2024-06-28 ENCOUNTER — Encounter (HOSPITAL_BASED_OUTPATIENT_CLINIC_OR_DEPARTMENT_OTHER): Payer: Self-pay

## 2024-07-14 ENCOUNTER — Ambulatory Visit (INDEPENDENT_AMBULATORY_CARE_PROVIDER_SITE_OTHER): Payer: PRIVATE HEALTH INSURANCE

## 2024-07-14 ENCOUNTER — Ambulatory Visit (INDEPENDENT_AMBULATORY_CARE_PROVIDER_SITE_OTHER): Payer: PRIVATE HEALTH INSURANCE | Admitting: Sports Medicine

## 2024-07-14 DIAGNOSIS — M7711 Lateral epicondylitis, right elbow: Secondary | ICD-10-CM

## 2024-07-14 DIAGNOSIS — M7062 Trochanteric bursitis, left hip: Secondary | ICD-10-CM | POA: Diagnosis not present

## 2024-07-14 DIAGNOSIS — M1712 Unilateral primary osteoarthritis, left knee: Secondary | ICD-10-CM

## 2024-07-14 MED ORDER — TRAMADOL HCL 50 MG PO TABS: 50.0000 mg | ORAL_TABLET | Freq: Three times a day (TID) | ORAL | 0 refills | Status: AC | PRN

## 2024-07-14 NOTE — Assessment & Plan Note (Signed)
 53 year old female, increasing left lateral hip pain, she was doing a lot of Pilates, weak hip abductor's, diagnosed trochanteric bursitis, she got some prednisone , we added Protonix , NSAIDs do hurt her stomach so we avoided those. X-rays were negative. She returns today doing a lot better, hips are stronger, continue home conditioning and return as needed.

## 2024-07-14 NOTE — Assessment & Plan Note (Signed)
 Increasing pain left knee anterior aspect, tenderness medial joint line, moderate gelling. Suspect early osteoarthritis, adding some x-rays, home conditioning, return as needed.

## 2024-07-14 NOTE — Progress Notes (Signed)
    Procedures performed today:    None.  Independent interpretation of notes and tests performed by another provider:   None.  Brief History, Exam, Impression, and Recommendations:    Primary osteoarthritis of left knee Increasing pain left knee anterior aspect, tenderness medial joint line, moderate gelling. Suspect early osteoarthritis, adding some x-rays, home conditioning, return as needed.  Trochanteric bursitis, left hip 53 year old female, increasing left lateral hip pain, she was doing a lot of Pilates, weak hip abductor's, diagnosed trochanteric bursitis, she got some prednisone , we added Protonix , NSAIDs do hurt her stomach so we avoided those. X-rays were negative. She returns today doing a lot better, hips are stronger, continue home conditioning and return as needed.    ____________________________________________ Debby PARAS. Curtis, M.D., ABFM., CAQSM., AME. Primary Care and Sports Medicine [facility]  Adjunct Professor of [facility] Medicine  [employer]  Restaurant manager, fast food

## 2024-07-22 ENCOUNTER — Ambulatory Visit (HOSPITAL_BASED_OUTPATIENT_CLINIC_OR_DEPARTMENT_OTHER): Payer: PRIVATE HEALTH INSURANCE | Admitting: Obstetrics & Gynecology

## 2024-07-26 ENCOUNTER — Encounter: Payer: Self-pay | Admitting: Sports Medicine

## 2024-08-08 NOTE — Progress Notes (Deleted)
   ANNUAL EXAM Patient name: Samantha Bird MRN [id number]  Date of birth: [birth year] Chief Complaint:   No chief complaint on file.  History of Present Illness:   Samantha Bird is a 53 y.o. G0P0000 Caucasian female being seen today for a routine annual exam.  Current complaints: ***  No LMP recorded (lmp unknown). Patient has had a hysterectomy.   The pregnancy intention screening data noted above was reviewed. Potential methods of contraception were discussed. The patient elected to proceed with No data recorded.   Last pap ***. Results were: {Pap findings:25134}. H/O abnormal pap: {yes/yes***/no:23866} Last mammogram: ***. Results were: {normal, abnormal, n/a:23837}. Family h/o breast cancer: {yes***/no:23838} Last colonoscopy: ***. Results were: {normal, abnormal, n/a:23837}. Family h/o colorectal cancer: {yes***/no:23838}     07/21/2023    1:05 PM 06/24/2022    1:23 PM 05/20/2022    9:50 AM 05/07/2022    2:40 PM 09/23/2021    2:57 PM  Depression screen PHQ 2/9  Decreased Interest 0 0 0 0 0  Down, Depressed, Hopeless 0 0 0 0 0  PHQ - 2 Score 0 0 0 0 0         No data to display           Review of Systems:   Pertinent items are noted in HPI Denies any headaches, blurred vision, fatigue, shortness of breath, chest pain, abdominal pain, abnormal vaginal discharge/itching/odor/irritation, problems with periods, bowel movements, urination, or intercourse unless otherwise stated above. Pertinent History Reviewed:  Reviewed past medical,surgical, social and family history.  Reviewed problem list, medications and allergies. Physical Assessment:  There were no vitals filed for this visit.There is no height or weight on file to calculate BMI.        Physical Examination:   General appearance - well appearing, and in no distress  Mental status - alert, oriented to person, place, and time  Psych:  She has a normal mood and affect  Skin - warm and dry, normal color,  no suspicious lesions noted  Chest - effort normal, all lung fields clear to auscultation bilaterally  Heart - normal rate and regular rhythm  Neck:  midline trachea, no thyromegaly or nodules  Breasts - breasts appear normal, no suspicious masses, no skin or nipple changes or  axillary nodes  Abdomen - soft, nontender, nondistended, no masses or organomegaly  Pelvic - VULVA: normal appearing vulva with no masses, tenderness or lesions  VAGINA: normal appearing vagina with normal color and discharge, no lesions  CERVIX: normal appearing cervix without discharge or lesions, no CMT  Thin prep pap is {Desc; done/not:10129} *** HR HPV cotesting  UTERUS: uterus is felt to be normal size, shape, consistency and nontender   ADNEXA: No adnexal masses or tenderness noted.  Rectal - normal rectal, good sphincter tone, no masses felt. Hemoccult: ***  Extremities:  No swelling or varicosities noted  Chaperone present for exam  No results found for this or any previous visit (from the past 24 hours).  Assessment & Plan:  1) Well-Woman Exam  2) ***  Labs/procedures today: ***  Mammogram: {Mammo f/u:25212::@ 55yo}, or sooner if problems Colonoscopy: {TCS f/u:25213::@ 53yo}, or sooner if problems  No orders of the defined types were placed in this encounter.   Meds: No orders of the defined types were placed in this encounter.   Follow-up: No follow-ups on file.  Pratt Bress E, RN 08/08/2024 3:10 PM

## 2024-08-09 ENCOUNTER — Encounter (HOSPITAL_BASED_OUTPATIENT_CLINIC_OR_DEPARTMENT_OTHER): Payer: Self-pay | Admitting: Obstetrics & Gynecology

## 2024-08-09 ENCOUNTER — Ambulatory Visit (HOSPITAL_BASED_OUTPATIENT_CLINIC_OR_DEPARTMENT_OTHER): Payer: PRIVATE HEALTH INSURANCE | Admitting: Obstetrics & Gynecology

## 2024-08-09 ENCOUNTER — Encounter (HOSPITAL_BASED_OUTPATIENT_CLINIC_OR_DEPARTMENT_OTHER): Payer: Self-pay

## 2024-11-07 NOTE — Progress Notes (Unsigned)
° °  ANNUAL EXAM Patient name: Samantha Bird MRN [id number]  Date of birth: 10/30/1971 Chief Complaint:   No chief complaint on file.  History of Present Illness:   Samantha Bird is a 53 y.o. G0P0000 Caucasian female being seen today for a routine annual exam.  Current complaints: ***  No LMP recorded (lmp unknown). Patient has had a hysterectomy.   The pregnancy intention screening data noted above was reviewed. Potential methods of contraception were discussed. The patient elected to proceed with No data recorded.   Last pap: Hysterectomy. 07/21/2023 NILM with/HPV negative. H/O abnormal pap: yes LEEP and Colpo in 2021 Last mammogram: 01/29/2024. Results were: normal. Family h/o breast cancer: {yes***/no:23838} Last colonoscopy: 07/20/2019. Results were: normal. Family h/o colorectal cancer: {yes***/no:23838}     07/21/2023    1:05 PM 06/24/2022    1:23 PM 05/20/2022    9:50 AM 05/07/2022    2:40 PM 09/23/2021    2:57 PM  Depression screen PHQ 2/9  Decreased Interest 0 0 0 0 0  Down, Depressed, Hopeless 0 0 0 0 0  PHQ - 2 Score 0 0 0 0 0         No data to display           Review of Systems:   Pertinent items are noted in HPI Denies any headaches, blurred vision, fatigue, shortness of breath, chest pain, abdominal pain, abnormal vaginal discharge/itching/odor/irritation, problems with periods, bowel movements, urination, or intercourse unless otherwise stated above. Pertinent History Reviewed:  Reviewed past medical,surgical, social and family history.  Reviewed problem list, medications and allergies. Physical Assessment:  There were no vitals filed for this visit.There is no height or weight on file to calculate BMI.        Physical Examination:   General appearance - well appearing, and in no distress  Mental status - alert, oriented to person, place, and time  Psych:  She has a normal mood and affect  Skin - warm and dry, normal color, no suspicious  lesions noted  Chest - effort normal, all lung fields clear to auscultation bilaterally  Heart - normal rate and regular rhythm  Neck:  midline trachea, no thyromegaly or nodules  Breasts - breasts appear normal, no suspicious masses, no skin or nipple changes or  axillary nodes  Abdomen - soft, nontender, nondistended, no masses or organomegaly  Pelvic - VULVA: normal appearing vulva with no masses, tenderness or lesions  VAGINA: normal appearing vagina with normal color and discharge, no lesions  CERVIX: normal appearing cervix without discharge or lesions, no CMT  Thin prep pap is {Desc; done/not:10129} *** HR HPV cotesting  UTERUS: uterus is felt to be normal size, shape, consistency and nontender   ADNEXA: No adnexal masses or tenderness noted.  Rectal - normal rectal, good sphincter tone, no masses felt. Hemoccult: ***  Extremities:  No swelling or varicosities noted  Chaperone present for exam  No results found for this or any previous visit (from the past 24 hours).  Assessment & Plan:  1) Well-Woman Exam  2) ***  Labs/procedures today: ***  Mammogram: {Mammo f/u:25212::@ 53yo}, or sooner if problems Colonoscopy: {TCS f/u:25213::@ 53yo}, or sooner if problems  No orders of the defined types were placed in this encounter.   Meds: No orders of the defined types were placed in this encounter.   Follow-up: No follow-ups on file.

## 2024-11-08 ENCOUNTER — Ambulatory Visit (HOSPITAL_BASED_OUTPATIENT_CLINIC_OR_DEPARTMENT_OTHER): Payer: PRIVATE HEALTH INSURANCE | Admitting: Obstetrics & Gynecology

## 2024-11-08 ENCOUNTER — Encounter (HOSPITAL_BASED_OUTPATIENT_CLINIC_OR_DEPARTMENT_OTHER): Payer: Self-pay | Admitting: Obstetrics & Gynecology

## 2024-11-08 ENCOUNTER — Other Ambulatory Visit (HOSPITAL_COMMUNITY)
Admission: RE | Admit: 2024-11-08 | Discharge: 2024-11-08 | Disposition: A | Payer: PRIVATE HEALTH INSURANCE | Source: Ambulatory Visit | Attending: Obstetrics & Gynecology | Admitting: Obstetrics & Gynecology

## 2024-11-08 VITALS — BP 103/60 | HR 80 | Ht 68.0 in | Wt 225.0 lb

## 2024-11-08 DIAGNOSIS — F5101 Primary insomnia: Secondary | ICD-10-CM | POA: Insufficient documentation

## 2024-11-08 DIAGNOSIS — Z1331 Encounter for screening for depression: Secondary | ICD-10-CM

## 2024-11-08 DIAGNOSIS — Z90710 Acquired absence of both cervix and uterus: Secondary | ICD-10-CM

## 2024-11-08 DIAGNOSIS — Z87410 Personal history of cervical dysplasia: Secondary | ICD-10-CM | POA: Diagnosis not present

## 2024-11-08 DIAGNOSIS — Z1272 Encounter for screening for malignant neoplasm of vagina: Secondary | ICD-10-CM | POA: Diagnosis not present

## 2024-11-08 DIAGNOSIS — N879 Dysplasia of cervix uteri, unspecified: Secondary | ICD-10-CM

## 2024-11-08 DIAGNOSIS — Z01419 Encounter for gynecological examination (general) (routine) without abnormal findings: Secondary | ICD-10-CM

## 2024-11-08 DIAGNOSIS — Z9889 Other specified postprocedural states: Secondary | ICD-10-CM

## 2024-11-08 MED ORDER — PROGESTERONE MICRONIZED 100 MG PO CAPS: ORAL_CAPSULE | ORAL | 1 refills | Status: DC

## 2024-11-11 LAB — CYTOLOGY - PAP: Diagnosis: NEGATIVE

## 2024-11-15 ENCOUNTER — Ambulatory Visit (HOSPITAL_BASED_OUTPATIENT_CLINIC_OR_DEPARTMENT_OTHER): Payer: Self-pay | Admitting: Obstetrics & Gynecology

## 2024-11-30 ENCOUNTER — Other Ambulatory Visit (HOSPITAL_BASED_OUTPATIENT_CLINIC_OR_DEPARTMENT_OTHER): Payer: Self-pay | Admitting: Obstetrics & Gynecology

## 2025-11-20 ENCOUNTER — Ambulatory Visit (HOSPITAL_BASED_OUTPATIENT_CLINIC_OR_DEPARTMENT_OTHER): Payer: Self-pay | Admitting: Obstetrics & Gynecology
